# Patient Record
Sex: Female | Born: 1955 | Race: Black or African American | Hispanic: No | Smoking: Never smoker
Health system: Southern US, Community
[De-identification: ages and names within clinical notes are randomized; demographics above are authoritative.]

## PROBLEM LIST (undated history)

## (undated) DIAGNOSIS — E119 Type 2 diabetes mellitus without complications: Secondary | ICD-10-CM

## (undated) DIAGNOSIS — G4730 Sleep apnea, unspecified: Secondary | ICD-10-CM

## (undated) DIAGNOSIS — I10 Essential (primary) hypertension: Secondary | ICD-10-CM

## (undated) DIAGNOSIS — K5790 Diverticulosis of intestine, part unspecified, without perforation or abscess without bleeding: Secondary | ICD-10-CM

## (undated) HISTORY — DX: Essential (primary) hypertension: I10

## (undated) HISTORY — DX: Sleep apnea, unspecified: G47.30

## (undated) HISTORY — DX: Type 2 diabetes mellitus without complications: E11.9

---

## 2003-01-16 ENCOUNTER — Other Ambulatory Visit: Admission: RE | Admit: 2003-01-16 | Discharge: 2003-01-16 | Payer: Self-pay | Admitting: Obstetrics and Gynecology

## 2003-03-05 ENCOUNTER — Encounter: Admission: RE | Admit: 2003-03-05 | Discharge: 2003-03-05 | Payer: Self-pay | Admitting: Obstetrics and Gynecology

## 2004-03-11 ENCOUNTER — Other Ambulatory Visit: Admission: RE | Admit: 2004-03-11 | Discharge: 2004-03-11 | Payer: Self-pay | Admitting: Obstetrics and Gynecology

## 2004-03-27 ENCOUNTER — Encounter: Admission: RE | Admit: 2004-03-27 | Discharge: 2004-03-27 | Payer: Self-pay | Admitting: Obstetrics and Gynecology

## 2005-03-17 ENCOUNTER — Other Ambulatory Visit: Admission: RE | Admit: 2005-03-17 | Discharge: 2005-03-17 | Payer: Self-pay | Admitting: Obstetrics and Gynecology

## 2005-04-06 ENCOUNTER — Encounter: Admission: RE | Admit: 2005-04-06 | Discharge: 2005-04-06 | Payer: Self-pay | Admitting: Obstetrics and Gynecology

## 2005-06-01 ENCOUNTER — Ambulatory Visit (HOSPITAL_BASED_OUTPATIENT_CLINIC_OR_DEPARTMENT_OTHER): Admission: RE | Admit: 2005-06-01 | Discharge: 2005-06-01 | Payer: Self-pay | Admitting: Family Medicine

## 2005-06-07 ENCOUNTER — Ambulatory Visit: Payer: Self-pay | Admitting: Internal Medicine

## 2006-04-09 ENCOUNTER — Encounter: Admission: RE | Admit: 2006-04-09 | Discharge: 2006-04-09 | Payer: Self-pay | Admitting: Obstetrics and Gynecology

## 2007-07-20 ENCOUNTER — Encounter: Admission: RE | Admit: 2007-07-20 | Discharge: 2007-07-20 | Payer: Self-pay | Admitting: Obstetrics and Gynecology

## 2007-07-29 DIAGNOSIS — R87810 Cervical high risk human papillomavirus (HPV) DNA test positive: Secondary | ICD-10-CM | POA: Insufficient documentation

## 2007-07-29 DIAGNOSIS — R87619 Unspecified abnormal cytological findings in specimens from cervix uteri: Secondary | ICD-10-CM | POA: Insufficient documentation

## 2008-07-01 ENCOUNTER — Emergency Department (HOSPITAL_BASED_OUTPATIENT_CLINIC_OR_DEPARTMENT_OTHER): Admission: EM | Admit: 2008-07-01 | Discharge: 2008-07-01 | Payer: Self-pay | Admitting: Emergency Medicine

## 2008-08-20 ENCOUNTER — Encounter: Admission: RE | Admit: 2008-08-20 | Discharge: 2008-08-20 | Payer: Self-pay | Admitting: Obstetrics and Gynecology

## 2009-09-13 ENCOUNTER — Encounter: Admission: RE | Admit: 2009-09-13 | Discharge: 2009-09-13 | Payer: Self-pay | Admitting: Obstetrics and Gynecology

## 2010-02-12 ENCOUNTER — Encounter: Admission: RE | Admit: 2010-02-12 | Payer: Self-pay | Source: Home / Self Care | Admitting: Family Medicine

## 2010-02-12 ENCOUNTER — Encounter
Admission: RE | Admit: 2010-02-12 | Discharge: 2010-02-12 | Payer: Self-pay | Source: Home / Self Care | Attending: Family Medicine | Admitting: Family Medicine

## 2010-06-20 NOTE — Procedures (Signed)
Sydney Cooper, Sydney Cooper                ACCOUNT NO.:  [id number]   MEDICAL RECORD NO.:  [id number]          PATIENT TYPE:  OUT   LOCATION:  SLEEP CENTER                 FACILITY:  [facility]   PHYSICIAN:  Clinton D. Maple Hudson, M.D. DATE OF BIRTH:  Jun 11, 1955   DATE OF STUDY:  06/01/2005                              NOCTURNAL POLYSOMNOGRAM   REFERRING PHYSICIAN:  Dr. Alwyn Pea.   INDICATIONS FOR STUDY:  Hypersomnia with sleep apnea.   EPWORTH SLEEPINESS SCORE:  08/24.   BMI:  1.  Weight 238 pounds.   No medication listed.   SLEEP ARCHITECTURE:  Total sleep time 346 minutes with sleep efficiency 75%.  Stage I was 8%, stage II 65%, stages III and IV 6%, REM 21% of total sleep  time.  Sleep latency 8 minutes, REM latency 101 minutes, awake after sleep  onset 109 minutes, arousal index 20.  No bedtime medication taken.   RESPIRATORY DATA:  Split study protocol.  Apnea/hypopnea index (AHI, RDI)  20.1 obstructive events per hour before CPAP indicating moderate obstructive  sleep apnea/hypopnea syndrome.  This included 23 obstructive apneas and 29  hypopneas before CPAP.  Events were not positional.  REM AHI 26.1 per hour.  CPAP was titrated to 7 CWP, AHI 0.8 per hour.  A medium Respironics OptiLife  nasal pillows mask was used with heated humidifier.  She experienced panic  attacks and claustrophobia on first putting the mask on and needed TV on  distract her. Once  sleep, she did well with CPAP.   OXYGEN DATA:  Moderate snoring with oxygen desaturation to a nadir of 89%.  After CPAP control saturation held 97-98% on room air.   CARDIAC DATA:  Normal sinus rhythm with PVCs including ventricular bigeminy  and trigeminy.   MOVEMENT/PARASOMNIAS:  No unusual limb or body movement.  Bathroom x2.   IMPRESSION/RECOMMENDATIONS:  1.  Moderate obstructive sleep apnea/hypopnea syndrome, AHI 20.1 per hour      with non positional events, moderate snoring and oxygen desaturation to      a nadir of  89%.  2.  Successful CPAP titration to 7 CWP, AHI 0.8 per hour.  A medium      Respironics OptiLife nasal pillows mask was used with heated humidifier.      She had an initial panic and claustrophobia response to the sensation of      applying mask but adjusted and subsequently did well.  3.  Frequent PVCs including ventricular bigeminy and trigeminy.      Clinton D. Maple Hudson, M.D.  Diplomate, Biomedical engineer of Sleep Medicine  Electronically Signed     CDY/MEDQ  D:  06/07/2005 10:44:40  T:  06/08/2005 12:11:59  Job:  [id number]

## 2011-04-28 ENCOUNTER — Ambulatory Visit (INDEPENDENT_AMBULATORY_CARE_PROVIDER_SITE_OTHER): Payer: Federal, State, Local not specified - PPO | Admitting: Obstetrics and Gynecology

## 2011-04-28 DIAGNOSIS — Z124 Encounter for screening for malignant neoplasm of cervix: Secondary | ICD-10-CM

## 2011-04-28 DIAGNOSIS — Z01419 Encounter for gynecological examination (general) (routine) without abnormal findings: Secondary | ICD-10-CM

## 2011-12-09 ENCOUNTER — Other Ambulatory Visit: Payer: Self-pay | Admitting: Obstetrics and Gynecology

## 2011-12-09 DIAGNOSIS — Z1231 Encounter for screening mammogram for malignant neoplasm of breast: Secondary | ICD-10-CM

## 2011-12-17 ENCOUNTER — Ambulatory Visit
Admission: RE | Admit: 2011-12-17 | Discharge: 2011-12-17 | Disposition: A | Discharge status: Home / Self Care | Payer: Federal, State, Local not specified - PPO | Source: Ambulatory Visit | Attending: Obstetrics and Gynecology | Admitting: Obstetrics and Gynecology

## 2011-12-17 DIAGNOSIS — Z1231 Encounter for screening mammogram for malignant neoplasm of breast: Secondary | ICD-10-CM

## 2011-12-18 ENCOUNTER — Encounter: Payer: Self-pay | Admitting: Obstetrics and Gynecology

## 2013-01-24 ENCOUNTER — Other Ambulatory Visit: Payer: Self-pay

## 2013-01-24 DIAGNOSIS — Z1231 Encounter for screening mammogram for malignant neoplasm of breast: Secondary | ICD-10-CM

## 2013-01-30 ENCOUNTER — Ambulatory Visit: Payer: Federal, State, Local not specified - PPO

## 2013-02-07 ENCOUNTER — Ambulatory Visit
Admission: RE | Admit: 2013-02-07 | Discharge: 2013-02-07 | Disposition: A | Discharge status: Home / Self Care | Payer: Federal, State, Local not specified - PPO | Source: Ambulatory Visit

## 2013-02-07 DIAGNOSIS — Z1231 Encounter for screening mammogram for malignant neoplasm of breast: Secondary | ICD-10-CM

## 2014-01-23 ENCOUNTER — Encounter: Payer: Federal, State, Local not specified - PPO | Attending: Family Medicine | Admitting: Dietician

## 2014-01-23 ENCOUNTER — Encounter: Payer: Self-pay | Admitting: Dietician

## 2014-01-23 VITALS — Ht 68.0 in | Wt 254.0 lb

## 2014-01-23 DIAGNOSIS — Z6838 Body mass index (BMI) 38.0-38.9, adult: Secondary | ICD-10-CM | POA: Insufficient documentation

## 2014-01-23 DIAGNOSIS — E669 Obesity, unspecified: Secondary | ICD-10-CM | POA: Insufficient documentation

## 2014-01-23 DIAGNOSIS — Z713 Dietary counseling and surveillance: Secondary | ICD-10-CM | POA: Diagnosis not present

## 2014-01-23 NOTE — Progress Notes (Signed)
  Medical Nutrition Therapy:  Appt start time: 1400 end time:  1500.   Assessment:  Primary concerns today: Weight/sleep apnea, HTN.   Lives alone and does her own shopping.  Usually 2 meals and 1 snack daily.  Occasionally 3 meals daily.  Increased restaurant.  Travels a lot.  Patient recently retired from [employer].  Increased eating out and irregular meal pattern.  Good exercise habits.  Patient states that she is not a stress eater.  At home eats in front of the TV.   Preferred Learning Style:   Auditory  Visual  Hands on   Learning Readiness:   Ready   MEDICATIONS: Reviewed   DIETARY INTAKE:  Usual eating pattern includes 2 meals and 1 snacks per day.  Avoided foods include none.    24-hr recall:  B ( AM): Bagel with cream cheese and coffee or bacon and eggs once per week, or meal replacement shake, or smoothie with ice, frozen fruit  Snk ( AM): none  L ( PM): 2:00 out to eat ([place], [place], burger and fries or wings or other out to eat) Snk ( PM): none D ( PM): none Snk ( PM): Cheese and crackers Beverages:  About 24 oz Water, 6 oz wine 2-3 x per week, occasional iced tea (1/2 and 1/2 sweet)  Occasionally will eat 3 meals  Usual physical activity: Walks about 2 miles 5-6 days per week with neighbor.  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    Estimated energy needs: 1400-1600 calories  Progress Towards Goal(s):  In progress.   Nutritional Diagnosis:  [place]-3.3 Overweight/obesity As related to increased caloric intake.  As evidenced by BMI of 38.6.    Intervention:  Nutrition Counceling. Discussed more regular meal schedule, increased water intake, healthy snack choices, eating out, mindful eating. Teaching Method Utilized:  Visual Auditory Hands on  Handouts given during visit include:  Plate method  Snack list  Meal planning card  Weight loss tips  Tanita strip and fact sheet  Barriers to learning/adherence to lifestyle change:  none  Demonstrated degree of understanding via:  Teach Back   Monitoring/Evaluation:  Dietary intake, exercise, and body weight in 1 month(s).

## 2014-01-23 NOTE — Patient Instructions (Signed)
Continue great exercise habits! Add a protein to breakfast. Increase your water intake. Snack choices with a protein and carbohydrate choice Plan and cook more often rather than eating out   (Eat out 2x/week and cook 5x per week) Being mindful of food choices when eating out.

## 2014-02-26 ENCOUNTER — Ambulatory Visit: Payer: Federal, State, Local not specified - PPO | Admitting: Dietician

## 2014-07-31 ENCOUNTER — Emergency Department (HOSPITAL_BASED_OUTPATIENT_CLINIC_OR_DEPARTMENT_OTHER)
Admission: EM | Admit: 2014-07-31 | Discharge: 2014-07-31 | Disposition: A | Discharge status: Home / Self Care | Payer: Federal, State, Local not specified - PPO | Attending: Emergency Medicine | Admitting: Emergency Medicine

## 2014-07-31 ENCOUNTER — Emergency Department (HOSPITAL_BASED_OUTPATIENT_CLINIC_OR_DEPARTMENT_OTHER): Payer: Federal, State, Local not specified - PPO

## 2014-07-31 ENCOUNTER — Encounter (HOSPITAL_BASED_OUTPATIENT_CLINIC_OR_DEPARTMENT_OTHER): Payer: Self-pay

## 2014-07-31 DIAGNOSIS — Z79899 Other long term (current) drug therapy: Secondary | ICD-10-CM | POA: Diagnosis not present

## 2014-07-31 DIAGNOSIS — Z8669 Personal history of other diseases of the nervous system and sense organs: Secondary | ICD-10-CM | POA: Insufficient documentation

## 2014-07-31 DIAGNOSIS — K5730 Diverticulosis of large intestine without perforation or abscess without bleeding: Secondary | ICD-10-CM | POA: Diagnosis not present

## 2014-07-31 DIAGNOSIS — I10 Essential (primary) hypertension: Secondary | ICD-10-CM | POA: Insufficient documentation

## 2014-07-31 DIAGNOSIS — K5732 Diverticulitis of large intestine without perforation or abscess without bleeding: Secondary | ICD-10-CM

## 2014-07-31 DIAGNOSIS — Z3202 Encounter for pregnancy test, result negative: Secondary | ICD-10-CM | POA: Insufficient documentation

## 2014-07-31 DIAGNOSIS — R1032 Left lower quadrant pain: Secondary | ICD-10-CM | POA: Diagnosis present

## 2014-07-31 LAB — CBC WITH DIFFERENTIAL/PLATELET
BASOS ABS: 0.1 10*3/uL (ref 0.0–0.1)
Basophils Relative: 1 % (ref 0–1)
Eosinophils Absolute: 0.1 10*3/uL (ref 0.0–0.7)
Eosinophils Relative: 2 % (ref 0–5)
HCT: 41 % (ref 36.0–46.0)
HEMOGLOBIN: 13.4 g/dL (ref 12.0–15.0)
LYMPHS ABS: 1.6 10*3/uL (ref 0.7–4.0)
LYMPHS PCT: 24 % (ref 12–46)
MCH: 30.6 pg (ref 26.0–34.0)
MCHC: 32.7 g/dL (ref 30.0–36.0)
MCV: 93.6 fL (ref 78.0–100.0)
Monocytes Absolute: 0.4 10*3/uL (ref 0.1–1.0)
Monocytes Relative: 5 % (ref 3–12)
NEUTROS ABS: 4.7 10*3/uL (ref 1.7–7.7)
NEUTROS PCT: 68 % (ref 43–77)
PLATELETS: 297 10*3/uL (ref 150–400)
RBC: 4.38 MIL/uL (ref 3.87–5.11)
RDW: 13.1 % (ref 11.5–15.5)
WBC: 6.8 10*3/uL (ref 4.0–10.5)

## 2014-07-31 LAB — URINALYSIS, ROUTINE W REFLEX MICROSCOPIC
Bilirubin Urine: NEGATIVE
Glucose, UA: NEGATIVE mg/dL
Hgb urine dipstick: NEGATIVE
KETONES UR: NEGATIVE mg/dL
NITRITE: NEGATIVE
PH: 5.5 (ref 5.0–8.0)
Protein, ur: NEGATIVE mg/dL
SPECIFIC GRAVITY, URINE: 1.023 (ref 1.005–1.030)
Urobilinogen, UA: 1 mg/dL (ref 0.0–1.0)

## 2014-07-31 LAB — COMPREHENSIVE METABOLIC PANEL
ALT: 20 U/L (ref 14–54)
ANION GAP: 11 (ref 5–15)
AST: 22 U/L (ref 15–41)
Albumin: 3.9 g/dL (ref 3.5–5.0)
Alkaline Phosphatase: 58 U/L (ref 38–126)
BUN: 17 mg/dL (ref 6–20)
CALCIUM: 8.9 mg/dL (ref 8.9–10.3)
CHLORIDE: 104 mmol/L (ref 101–111)
CO2: 24 mmol/L (ref 22–32)
CREATININE: 0.92 mg/dL (ref 0.44–1.00)
GFR calc Af Amer: >60 mL/min (ref >60)
GFR calc non Af Amer: >60 mL/min (ref >60)
GLUCOSE: 108 mg/dL — AB (ref 65–99)
Potassium: 3.8 mmol/L (ref 3.5–5.1)
Sodium: 139 mmol/L (ref 135–145)
TOTAL PROTEIN: 7.8 g/dL (ref 6.5–8.1)
Total Bilirubin: 0.4 mg/dL (ref 0.3–1.2)

## 2014-07-31 LAB — PREGNANCY, URINE: Preg Test, Ur: NEGATIVE

## 2014-07-31 LAB — URINE MICROSCOPIC-ADD ON

## 2014-07-31 MED ORDER — METRONIDAZOLE 500 MG PO TABS: 500.0000 mg | ORAL_TABLET | Freq: Two times a day (BID) | ORAL | Status: DC

## 2014-07-31 MED ORDER — ONDANSETRON HCL 4 MG/2ML IJ SOLN
4.0000 mg | Freq: Once | INTRAMUSCULAR | Status: AC
  Administered 2014-07-31: 4 mg via INTRAVENOUS
  Filled 2014-07-31: qty 2

## 2014-07-31 MED ORDER — OXYCODONE-ACETAMINOPHEN 5-325 MG PO TABS
1.0000 | ORAL_TABLET | Freq: Three times a day (TID) | ORAL | Status: DC | PRN
Start: 2014-07-31 — End: 2014-09-07

## 2014-07-31 MED ORDER — CIPROFLOXACIN HCL 500 MG PO TABS: 500.0000 mg | ORAL_TABLET | Freq: Two times a day (BID) | ORAL | Status: DC

## 2014-07-31 MED ORDER — MORPHINE SULFATE 4 MG/ML IJ SOLN
4.0000 mg | Freq: Once | INTRAMUSCULAR | Status: AC
  Administered 2014-07-31: 4 mg via INTRAVENOUS
  Filled 2014-07-31: qty 1

## 2014-07-31 NOTE — Discharge Instructions (Signed)
As discussed if you develop fever of worsening symptoms then you need to be reseen Diverticulitis Diverticulitis is when small pockets that have formed in your colon (large intestine) become infected or swollen. HOME CARE  Follow your doctor's instructions.  Follow a special diet if told by your doctor.  When you feel better, your doctor may tell you to change your diet. You may be told to eat a lot of fiber. Fruits and vegetables are good sources of fiber. Fiber makes it easier to poop (have bowel movements).  Take supplements or probiotics as told by your doctor.  Only take medicines as told by your doctor.  Keep all follow-up visits with your doctor. GET HELP IF:  Your pain does not get better.  You have a hard time eating food.  You are not pooping like normal. GET HELP RIGHT AWAY IF:  Your pain gets worse.  Your problems do not get better.  Your problems suddenly get worse.  You have a fever.  You keep throwing up (vomiting).  You have bloody or black, tarry poop (stool). MAKE SURE YOU:   Understand these instructions.  Will watch your condition.  Will get help right away if you are not doing well or get worse. Document Released: 07/08/2007 Document Revised: 01/24/2013 Document Reviewed: 12/14/2012 [facility] Patient Information 2015 [place] [place]. This information is not intended to replace advice given to you by your health care provider. Make sure you discuss any questions you have with your health care provider.

## 2014-07-31 NOTE — ED Provider Notes (Signed)
CSN: [id number]     Arrival date & time 07/31/14  1349 History   First MD Initiated Contact with Patient 07/31/14 1404     Chief Complaint  Patient presents with  . Abdominal Pain     (Consider location/radiation/quality/duration/timing/severity/associated sxs/prior Treatment) Patient is a 59 y.o. female presenting with abdominal pain. The history is provided by the patient. No language interpreter was used.  Abdominal Pain Pain location:  LLQ Pain quality: aching   Pain radiates to:  Back Pain severity:  Moderate Onset quality:  Gradual Duration:  3 days Progression:  Worsening Relieved by:  Nothing Worsened by:  Position changes Ineffective treatments:  NSAIDs Associated symptoms: nausea   Associated symptoms: no dysuria, no fever, no vaginal discharge and no vomiting     Past Medical History  Diagnosis Date  . Hypertension   . Sleep apnea    No past surgical history on file. No family history on file. History  Substance Use Topics  . Smoking status: Never Smoker   . Smokeless tobacco: Not on file  . Alcohol Use: No   OB History    No data available     Review of Systems  Constitutional: Negative for fever.  Gastrointestinal: Positive for nausea and abdominal pain. Negative for vomiting.  Genitourinary: Negative for dysuria and vaginal discharge.  All other systems reviewed and are negative.     Allergies  Review of patient's allergies indicates no known allergies.  Home Medications   Prior to Admission medications   Medication Sig Start Date End Date Taking? Authorizing Provider  hydrochlorothiazide (HYDRODIURIL) 25 MG tablet Take 25 mg by mouth daily.    Historical Provider, MD  olmesartan (BENICAR) 40 MG tablet Take 40 mg by mouth daily.    Historical Provider, MD   BP 120/52 mmHg  Pulse 69  Temp(Src) 98.7 F (37.1 C) (Oral)  Resp 16  Ht  (1.727 m)  Wt 247 lb (112.038 kg)  BMI 37.56 kg/m2  SpO2 98% Physical Exam  Constitutional: She is  oriented to person, place, and time. She appears well-developed and well-nourished.  Cardiovascular: Normal rate and regular rhythm.   Pulmonary/Chest: Effort normal and breath sounds normal.  Abdominal: Soft. Bowel sounds are normal. There is tenderness in the left lower quadrant.  Musculoskeletal: Normal range of motion.  Neurological: She is alert and oriented to person, place, and time. She exhibits normal muscle tone. Coordination normal.  Skin: Skin is warm and dry.  Psychiatric: She has a normal mood and affect.  Nursing note and vitals reviewed.   ED Course  Procedures (including critical care time) Labs Review Labs Reviewed  URINALYSIS, ROUTINE W REFLEX MICROSCOPIC (NOT AT [facility]) - Abnormal; Notable for the following:    Leukocytes, UA SMALL (*)    All other components within normal limits  COMPREHENSIVE METABOLIC PANEL - Abnormal; Notable for the following:    Glucose, Bld 108 (*)    All other components within normal limits  URINE MICROSCOPIC-ADD ON - Abnormal; Notable for the following:    Bacteria, UA FEW (*)    All other components within normal limits  PREGNANCY, URINE  CBC WITH DIFFERENTIAL/PLATELET    Imaging Review Ct Abdomen Pelvis Wo Contrast  07/31/2014   CLINICAL DATA:  Left lower quadrant pain for 3 days, low back pain for 4 these,  EXAM: CT ABDOMEN AND PELVIS WITHOUT CONTRAST  TECHNIQUE: Multidetector CT imaging of the abdomen and pelvis was performed following the standard protocol without IV contrast.  COMPARISON:  None.  FINDINGS: Lung bases are unremarkable. A emphysematous bulla is noted in left base retrocardiac. Sagittal images of the spine shows small Schmorl's node deformity upper endplate of L3 vertebral body.  Unenhanced liver shows no biliary ductal dilatation. No calcified gallstones are noted within gallbladder. Unenhanced pancreas, spleen and adrenal glands are unremarkable.  Abdominal aorta is unremarkable.  Kidneys are symmetrical in size. No  nephrolithiasis. No hydronephrosis or hydroureter. No calcified ureteral calculi are noted. The urinary bladder is under distended unremarkable.  No small bowel obstruction. No ascites or free air. No adenopathy. There is no pericecal inflammation. Normal retrocecal appendix noted in axial image 56. The terminal ileum is unremarkable. Scattered colonic diverticula are noted descending colon. Multiple sigmoid colon diverticula. There is no evidence of acute diverticulitis.  In axial image 61 there is subtle mild stranding of pericolonic fat in left lower quadrant proximal sigmoid colon. Findings are suspicious for mild focal diverticulitis. There is no evidence of diverticular abscess. No evidence of perforation. Multiple colonic diverticula are noted in proximal sigmoid colon. No other foci of diverticulitis are not  The unenhanced uterus and adnexa are unremarkable.  IMPRESSION: 1. Normal appendix.  No pericecal inflammation. 2. No nephrolithiasis.  No hydronephrosis or hydroureter. 3. No calcified ureteral calculi. 4. Scattered diverticula are noted descending colon. There is subtle mild stranding of pericolonic fat in proximal sigmoid colon in left lower quadrant axial image 61. Mild diverticulitis cannot be excluded. There is no diverticular abscess. No perforation. Additional multiple colonic diverticula are noted in proximal sigmoid colon without other foci of acute diverticulitis. 5. Unremarkable unenhanced uterus and adnexa.   Electronically Signed   By: Natasha MeadLiviu  Pop M.D.   On: 07/31/2014 14:47     EKG Interpretation None      MDM   Final diagnoses:  Diverticulitis of sigmoid colon    Will treat for possible diverticulitis with cipro and flagyl and oxycodone. Discussed return precautions.no definite abscess noted.    Teressa LowerVrinda Keerat Denicola, NP 07/31/14 1603  Jerelyn ScottMartha Linker, MD 08/03/14 1525

## 2014-07-31 NOTE — ED Notes (Signed)
LLQ pain x 3 days-lower back pain x 4 days

## 2014-08-17 ENCOUNTER — Encounter: Payer: Self-pay | Admitting: Podiatry

## 2014-08-17 ENCOUNTER — Ambulatory Visit (INDEPENDENT_AMBULATORY_CARE_PROVIDER_SITE_OTHER): Payer: Federal, State, Local not specified - PPO | Admitting: Podiatry

## 2014-08-17 ENCOUNTER — Ambulatory Visit (INDEPENDENT_AMBULATORY_CARE_PROVIDER_SITE_OTHER): Payer: Federal, State, Local not specified - PPO

## 2014-08-17 VITALS — BP 120/80 | HR 68 | Resp 15

## 2014-08-17 DIAGNOSIS — M779 Enthesopathy, unspecified: Secondary | ICD-10-CM

## 2014-08-17 DIAGNOSIS — M79672 Pain in left foot: Secondary | ICD-10-CM

## 2014-08-17 MED ORDER — TRIAMCINOLONE ACETONIDE 10 MG/ML IJ SUSP
10.0000 mg | Freq: Once | INTRAMUSCULAR | Status: AC
  Administered 2014-08-17: 10 mg

## 2014-08-17 MED ORDER — NAPROXEN 500 MG PO TABS: 500.0000 mg | ORAL_TABLET | Freq: Two times a day (BID) | ORAL | Status: DC

## 2014-08-17 NOTE — Progress Notes (Signed)
Subjective:     Patient ID: Sydney ButteDeborah Cooper, female   DOB: [birth year], 59 y.o.   MRN: [id number]  HPI patient states she's been having a lot of pain in her right ankle and it started a couple months ago and she does not remember specific injury. States it's sore with walking or standing   Review of Systems  All other systems reviewed and are negative.      Objective:   Physical Exam  Constitutional: She is oriented to person, place, and time.  Cardiovascular: Intact distal pulses.   Musculoskeletal: Normal range of motion.  Neurological: She is oriented to person, place, and time.  Skin: Skin is warm.  Nursing note and vitals reviewed.  neurovascular status intact muscle strength adequate range of motion within normal limits with severe discomfort in the right ankle around the posterior tibial tendon as it comes under the medial malleolus and inserts into the navicular. I checked muscle strength and found to be adequate and patient was noted to have good digital perfusion and is well oriented 3     Assessment:     Posterior tibial tendinitis right with inflammation and moderate flatfoot deformity as complicating factor with chance for tear but unlikely at the current time    Plan:     H&P and x-rays reviewed and today I went ahead and I did a careful sheath injection first explaining chances for rupture 3 mg Kenalog 5 mg Xylocaine and then went ahead and applied fascial brace with instructions and discussed long-term orthotics. Reappoint to recheck in 3 weeks or earlier if needed and begin diclofenac 75 mg twice a day [employer]

## 2014-08-17 NOTE — Progress Notes (Signed)
   Subjective:    Patient ID: Sydney ButteDeborah Cooper, female    DOB: 22-Oct-1955, 59 y.o.   MRN: [id number]  HPI P t presents with pain on medial side of right ankle at the ankle jointed with noted swelling. She states this has lasted for 2 molnths, she walks frequently and wraps foot and wears supportive shoes but pain and swelling have worsened   Review of Systems  All other systems reviewed and are negative.      Objective:   Physical Exam        Assessment & Plan:

## 2014-09-07 ENCOUNTER — Encounter: Payer: Self-pay | Admitting: Podiatry

## 2014-09-07 ENCOUNTER — Ambulatory Visit (INDEPENDENT_AMBULATORY_CARE_PROVIDER_SITE_OTHER): Payer: Federal, State, Local not specified - PPO | Admitting: Podiatry

## 2014-09-07 VITALS — BP 104/53 | HR 63 | Resp 14

## 2014-09-07 DIAGNOSIS — M779 Enthesopathy, unspecified: Secondary | ICD-10-CM

## 2014-09-08 NOTE — Progress Notes (Signed)
Subjective:     Patient ID: Sydney Cooper, female   DOB: 11-26-1955, 59 y.o.   MRN: [id number]  HPI patient states my right heel is doing some better but still painful and I know I have the depression of the arch which is a part of the problem   Review of Systems     Objective:   Physical Exam Neurovascular status intact muscle strength adequate range of motion within normal limits with patient noted to have quite a bit of discomfort plantar aspect right heel at the insertional point tendon the calcaneus with fluid buildup around the medial band and depression of the arch noted    Assessment:     Continue plantar fasciitis with moderate improvement right and long-term structural abnormalities    Plan:     Reviewed condition and at this time went ahead and scanned for custom orthotic devices. I then went ahead reviewed continued physical therapy and shoe gear modifications

## 2014-10-19 ENCOUNTER — Encounter: Payer: Federal, State, Local not specified - PPO | Attending: Family Medicine | Admitting: *Deleted

## 2014-10-19 ENCOUNTER — Encounter: Payer: Self-pay | Admitting: *Deleted

## 2014-10-19 DIAGNOSIS — Z713 Dietary counseling and surveillance: Secondary | ICD-10-CM | POA: Insufficient documentation

## 2014-10-19 DIAGNOSIS — Z6837 Body mass index (BMI) 37.0-37.9, adult: Secondary | ICD-10-CM | POA: Diagnosis not present

## 2014-10-19 DIAGNOSIS — E669 Obesity, unspecified: Secondary | ICD-10-CM | POA: Diagnosis not present

## 2014-10-19 DIAGNOSIS — R7303 Prediabetes: Secondary | ICD-10-CM

## 2014-10-19 DIAGNOSIS — R7309 Other abnormal glucose: Secondary | ICD-10-CM | POA: Diagnosis not present

## 2014-10-19 NOTE — Progress Notes (Signed)
Diabetes Self-Management Education  Visit Type:  Follow-up.  Patient was seen in this clinic 01/2014.  Is here for more education on her prediabetes  Appt. Start Time: 0900 Appt. End Time: 1000  10/19/2014  Ms. Sydney Cooper, identified by name and date of birth, is a 59 y.o. female with a diagnosis of pre-Diabetes:  .   ASSESSMENT  There were no vitals taken for this visit. There is no weight on file to calculate BMI.       Diabetes Self-Management Education - 10/19/14 0907    Health Coping   How would you rate your overall health? Good   Psychosocial Assessment   Patient Belief/Attitude about Diabetes Afraid   Self-care barriers None   Self-management support Doctor's office;Family   Patient Concerns Nutrition/Meal planning;Glycemic Control;Weight Control   Preferred Learning Style Auditory;Visual;Hands on   Learning Readiness Ready   Complications   Last HgB A1C per patient/outside source 6.1 %   How often do you check your blood sugar? 0 times/day (not testing)   Have you had a dilated eye exam in the past 12 months? No   Have you had a dental exam in the past 12 months? Yes   Are you checking your feet? No   Dietary Intake   Breakfast hot tea and 1 slice oatmeal toast with butter and some jelly   Lunch late lunch yesterday: clam chowder with tossed salad with ranch.  Psychologist, educational, fruit   Beverage(s) white wine, tea, water   Exercise   Exercise Type Light (walking / raking leaves)   How many days per week to you exercise? 6   How many minutes per day do you exercise? 45   Total minutes per week of exercise 270   Patient Education   Previous Diabetes Education No   Disease state  Definition of diabetes, type 1 and 2, and the diagnosis of diabetes;Factors that contribute to the development of diabetes;Explored patient's options for treatment of their diabetes   Nutrition management  Role of diet in the treatment of diabetes and the relationship  between the three main macronutrients and blood glucose level;Food label reading, portion sizes and measuring food.;Carbohydrate counting;Effects of alcohol on blood glucose and safety factors with consumption of alcohol.;Information on hints to eating out and maintain blood glucose control.   Physical activity and exercise  Role of exercise on diabetes management, blood pressure control and cardiac health.   Monitoring Purpose and frequency of SMBG.;Daily foot exams;Yearly dilated eye exam   Acute complications Taught treatment of hypoglycemia - the 15 rule.   Chronic complications Relationship between chronic complications and blood glucose control   Psychosocial adjustment Role of stress on diabetes   Individualized Goals (developed by patient)   Nutrition Follow meal plan discussed;General guidelines for healthy choices and portions discussed   Physical Activity Exercise 5-7 days per week   Outcomes   Program Status Completed   Subsequent Visit   Since your last visit have you continued or begun to take your medications as prescribed? Not on Medications   Since your last visit have you had your blood pressure checked? Yes   Since your last visit have you experienced any weight changes? No change   Since your last visit, are you checking your blood glucose at least once a day? No      Learning Objective:  Patient will have a greater understanding of diabetes self-management. Patient education plan is to attend individual and/or group sessions  per assessed needs and concerns.     Expected Outcomes:  Demonstrated interest in learning. Expect positive outcomes  Education material provided: Living Well with Diabetes, Meal plan card and Snack sheet  If problems or questions, patient to contact team via:  Phone  Future DSME appointment: - 6 months

## 2015-01-28 ENCOUNTER — Encounter: Payer: Self-pay | Admitting: Podiatry

## 2015-01-28 ENCOUNTER — Ambulatory Visit (INDEPENDENT_AMBULATORY_CARE_PROVIDER_SITE_OTHER): Payer: Federal, State, Local not specified - PPO | Admitting: Podiatry

## 2015-01-28 DIAGNOSIS — M779 Enthesopathy, unspecified: Secondary | ICD-10-CM

## 2015-01-28 DIAGNOSIS — M79672 Pain in left foot: Secondary | ICD-10-CM

## 2015-01-28 MED ORDER — TRIAMCINOLONE ACETONIDE 10 MG/ML IJ SUSP
10.0000 mg | Freq: Once | INTRAMUSCULAR | Status: AC
  Administered 2015-01-28: 10 mg

## 2015-01-28 NOTE — Progress Notes (Signed)
Subjective:     Patient ID: Sydney ButteDeborah Cooper, female   DOB: 21-Nov-1955, 59 y.o.   MRN: [id number]  HPI patient points to the right foot stating that the tendon on the inside is really bothering her and she admits that she's been more active the last month and has not been able to wear her orthotics at all times and there is quite a few shoes that her orthotics do not fit   Review of Systems     Objective:   Physical Exam Neurovascular status intact muscle strength adequate with inflammation in the posterior tibial tendon right at its insertion into the navicular with moderate depression of the arch noted    Assessment:     Inflammatory tendinitis of the navicular posterior tibial junction right with no indication of muscle dysfunction and depression of the arch    Plan:     Scanned for secondary orthotics in order to provide for better support of the foot structure and went ahead today and did careful sheath injection right 3 mg Kenalog 5 mg Xylocaine and advised on reduced activity

## 2015-02-25 ENCOUNTER — Encounter: Payer: Self-pay | Admitting: Podiatry

## 2015-04-18 ENCOUNTER — Ambulatory Visit: Payer: Federal, State, Local not specified - PPO | Admitting: *Deleted

## 2015-04-28 ENCOUNTER — Emergency Department (HOSPITAL_BASED_OUTPATIENT_CLINIC_OR_DEPARTMENT_OTHER)
Admission: EM | Admit: 2015-04-28 | Discharge: 2015-04-28 | Disposition: A | Discharge status: Home / Self Care | Payer: Federal, State, Local not specified - PPO | Attending: Emergency Medicine | Admitting: Emergency Medicine

## 2015-04-28 ENCOUNTER — Encounter (HOSPITAL_BASED_OUTPATIENT_CLINIC_OR_DEPARTMENT_OTHER): Payer: Self-pay | Admitting: Emergency Medicine

## 2015-04-28 ENCOUNTER — Emergency Department (HOSPITAL_BASED_OUTPATIENT_CLINIC_OR_DEPARTMENT_OTHER): Payer: Federal, State, Local not specified - PPO

## 2015-04-28 DIAGNOSIS — Z8719 Personal history of other diseases of the digestive system: Secondary | ICD-10-CM | POA: Insufficient documentation

## 2015-04-28 DIAGNOSIS — R1032 Left lower quadrant pain: Secondary | ICD-10-CM | POA: Diagnosis present

## 2015-04-28 DIAGNOSIS — I10 Essential (primary) hypertension: Secondary | ICD-10-CM | POA: Insufficient documentation

## 2015-04-28 DIAGNOSIS — E119 Type 2 diabetes mellitus without complications: Secondary | ICD-10-CM | POA: Insufficient documentation

## 2015-04-28 DIAGNOSIS — Z79899 Other long term (current) drug therapy: Secondary | ICD-10-CM | POA: Diagnosis not present

## 2015-04-28 DIAGNOSIS — Z8669 Personal history of other diseases of the nervous system and sense organs: Secondary | ICD-10-CM | POA: Insufficient documentation

## 2015-04-28 HISTORY — DX: Diverticulosis of intestine, part unspecified, without perforation or abscess without bleeding: K57.90

## 2015-04-28 LAB — COMPREHENSIVE METABOLIC PANEL
ALK PHOS: 65 U/L (ref 38–126)
ALT: 33 U/L (ref 14–54)
ANION GAP: 7 (ref 5–15)
AST: 39 U/L (ref 15–41)
Albumin: 3.8 g/dL (ref 3.5–5.0)
BILIRUBIN TOTAL: 0.4 mg/dL (ref 0.3–1.2)
BUN: 16 mg/dL (ref 6–20)
CALCIUM: 9 mg/dL (ref 8.9–10.3)
CO2: 25 mmol/L (ref 22–32)
CREATININE: 0.74 mg/dL (ref 0.44–1.00)
Chloride: 108 mmol/L (ref 101–111)
GFR calc non Af Amer: >60 mL/min (ref >60)
GLUCOSE: 102 mg/dL — AB (ref 65–99)
Potassium: 3.2 mmol/L — ABNORMAL LOW (ref 3.5–5.1)
SODIUM: 140 mmol/L (ref 135–145)
TOTAL PROTEIN: 7.6 g/dL (ref 6.5–8.1)

## 2015-04-28 LAB — CBC
HCT: 39.7 % (ref 36.0–46.0)
Hemoglobin: 13.2 g/dL (ref 12.0–15.0)
MCH: 31.1 pg (ref 26.0–34.0)
MCHC: 33.2 g/dL (ref 30.0–36.0)
MCV: 93.4 fL (ref 78.0–100.0)
PLATELETS: 288 10*3/uL (ref 150–400)
RBC: 4.25 MIL/uL (ref 3.87–5.11)
RDW: 13.5 % (ref 11.5–15.5)
WBC: 6.2 10*3/uL (ref 4.0–10.5)

## 2015-04-28 LAB — URINALYSIS, ROUTINE W REFLEX MICROSCOPIC
BILIRUBIN URINE: NEGATIVE
Glucose, UA: NEGATIVE mg/dL
Hgb urine dipstick: NEGATIVE
Ketones, ur: NEGATIVE mg/dL
NITRITE: NEGATIVE
PH: 6.5 (ref 5.0–8.0)
Protein, ur: NEGATIVE mg/dL
SPECIFIC GRAVITY, URINE: 1.021 (ref 1.005–1.030)

## 2015-04-28 LAB — WET PREP, GENITAL
Clue Cells Wet Prep HPF POC: NONE SEEN
Sperm: NONE SEEN
Trich, Wet Prep: NONE SEEN
Yeast Wet Prep HPF POC: NONE SEEN

## 2015-04-28 LAB — URINE MICROSCOPIC-ADD ON: RBC / HPF: NONE SEEN RBC/hpf (ref 0–5)

## 2015-04-28 LAB — LIPASE, BLOOD: Lipase: 26 U/L (ref 11–51)

## 2015-04-28 MED ORDER — TRAMADOL HCL 50 MG PO TABS: 50.0000 mg | ORAL_TABLET | Freq: Four times a day (QID) | ORAL | Status: DC | PRN

## 2015-04-28 MED ORDER — KETOROLAC TROMETHAMINE 15 MG/ML IJ SOLN
15.0000 mg | Freq: Once | INTRAMUSCULAR | Status: AC
  Administered 2015-04-28: 15 mg via INTRAVENOUS
  Filled 2015-04-28: qty 1

## 2015-04-28 MED ORDER — IOPAMIDOL (ISOVUE-300) INJECTION 61%
100.0000 mL | Freq: Once | INTRAVENOUS | Status: AC | PRN
  Administered 2015-04-28: 100 mL via INTRAVENOUS

## 2015-04-28 NOTE — Discharge Instructions (Signed)

## 2015-04-28 NOTE — ED Notes (Signed)
PIV removed from R AC; catheter intact; site unremarkable with no redness and/or swelling.

## 2015-04-28 NOTE — ED Notes (Addendum)
Last ate 1500. Last BM ~ 1 hr ago (here). Pt alert, NAD, calm, interactive, resps e/u, speaking in clear complete sentences, no dyspnea noted.

## 2015-04-28 NOTE — ED Notes (Signed)
Asked pt to provide urine specimen. 

## 2015-04-28 NOTE — ED Notes (Signed)
Pt recent history of diverticulitis, awoke this am with llq pain, awaiting to see md in am but pain is 8/10

## 2015-04-28 NOTE — ED Provider Notes (Signed)
CSN: [id number]     Arrival date & time 04/28/15  1614 History   First MD Initiated Contact with Patient 04/28/15 1736     Chief Complaint  Patient presents with  . Abdominal Pain   HPI   60 year old female presents today with left lower quadrant pain. Patient reports that symptoms started this morning upon awakening. She reports that is sharp and does not radiate, and had not required any over-the-counter medications. Patient reports this pain feels similar to previous episode for which she was seen in the emergency room with CT scan showing questionable diverticulitis. She was given antibiotics at that time which resolved her symptoms. Patient denies any fever, chills, nausea, vomiting, upper abdominal pain, lower pelvic pain, changes in the color clarity or characteristics of her urine, flank pain, vaginal discharge, vaginal bleeding, or any other acute concerns today. Patient reports she's been able to tolerate food and drink without difficulty with no exacerbation of her symptoms. Patient reports that ambulation makes the pain worse.   Past Medical History  Diagnosis Date  . Hypertension   . Sleep apnea   . Diabetes mellitus without complication (HCC)     prediabetic  . Diverticular disease    History reviewed. No pertinent past surgical history. Family History  Problem Relation Age of Onset  . Hypertension Mother   . COPD Mother   . Asthma Mother   . Diabetes Maternal Uncle    Social History  Substance Use Topics  . Smoking status: Never Smoker   . Smokeless tobacco: Never Used  . Alcohol Use: No   OB History    No data available     Review of Systems  All other systems reviewed and are negative.   Allergies  Review of patient's allergies indicates no known allergies.  Home Medications   Prior to Admission medications   Medication Sig Start Date End Date Taking? Authorizing Provider  hydrochlorothiazide (HYDRODIURIL) 25 MG tablet Take 25 mg by mouth daily.     Historical Provider, MD  Multiple Vitamin (MULTIVITAMIN) tablet Take 1 tablet by mouth.    Historical Provider, MD  olmesartan (BENICAR) 40 MG tablet Take 40 mg by mouth daily.    Historical Provider, MD  traMADol (ULTRAM) 50 MG tablet Take 1 tablet (50 mg total) by mouth every 6 (six) hours as needed. 04/28/15   Almena Hokenson, PA-C   BP 122/71 mmHg  Pulse 56  Temp(Src) 98.9 F (37.2 C) (Oral)  Resp 18  Ht 5\' 8"  (1.727 m)  Wt 113.399 kg  BMI 38.02 kg/m2  SpO2 98%   Physical Exam  Constitutional: She is oriented to person, place, and time. She appears well-developed and well-nourished.  HENT:  Head: Normocephalic and atraumatic.  Eyes: Conjunctivae are normal. Pupils are equal, round, and reactive to light. Right eye exhibits no discharge. Left eye exhibits no discharge. No scleral icterus.  Neck: Normal range of motion. No JVD present. No tracheal deviation present.  Pulmonary/Chest: Effort normal. No stridor.  Abdominal: Soft. She exhibits no distension and no mass. There is tenderness. There is no rebound and no guarding.    Left lower quadrant pain to deep palpation, no other abdominal tenderness noted. No rebound or guarding, masses noted nonsurgical abdomen  Genitourinary: Uterus normal. Cervix exhibits no motion tenderness, no discharge and no friability. Right adnexum displays no mass and no tenderness. Left adnexum displays no mass and no tenderness.  Musculoskeletal: Normal range of motion. She exhibits no edema or tenderness.  Neurological:  She is alert and oriented to person, place, and time. Coordination normal.  Skin: Skin is warm and dry.  Psychiatric: She has a normal mood and affect. Her behavior is normal. Judgment and thought content normal.  Nursing note and vitals reviewed.   ED Course  Procedures (including critical care time) Labs Review Labs Reviewed  WET PREP, GENITAL - Abnormal; Notable for the following:    WBC, Wet Prep HPF POC MANY (*)    All other  components within normal limits  URINALYSIS, ROUTINE W REFLEX MICROSCOPIC (NOT AT [facility]) - Abnormal; Notable for the following:    Leukocytes, UA MODERATE (*)    All other components within normal limits  URINE MICROSCOPIC-ADD ON - Abnormal; Notable for the following:    Squamous Epithelial / LPF 0-5 (*)    Bacteria, UA MANY (*)    All other components within normal limits  COMPREHENSIVE METABOLIC PANEL - Abnormal; Notable for the following:    Potassium 3.2 (*)    Glucose, Bld 102 (*)    All other components within normal limits  URINE CULTURE  LIPASE, BLOOD  CBC  GC/CHLAMYDIA PROBE AMP ([place]) NOT AT [facility]    Imaging Review Ct Abdomen Pelvis W Contrast  04/28/2015  CLINICAL DATA:  Left lower quadrant pain for 1 day EXAM: CT ABDOMEN AND PELVIS WITH CONTRAST TECHNIQUE: Multidetector CT imaging of the abdomen and pelvis was performed using the standard protocol following bolus administration of intravenous contrast. CONTRAST:  ISOVUE-300 IOPAMIDOL (ISOVUE-300) INJECTION 61% COMPARISON:  07/31/2014 FINDINGS: Lung bases are free of acute infiltrate or sizable effusion. Minimal atelectatic changes are seen. The liver, gallbladder, spleen, adrenal glands and pancreas are normal in their CT appearance. Kidneys are well visualized bilaterally and demonstrate a normal enhancement pattern. Normal excretion is seen. No renal calculi are noted. The appendix is well visualized and within normal limits. Fecal material is noted throughout the colon. Mild diverticular change is seen without evidence of diverticulitis. The bladder is well distended. The uterus and ovaries are within normal limits. Degenerative changes of lumbar spine are noted. IMPRESSION: Diverticulosis without diverticulitis. The minimal bibasilar atelectatic changes. No other focal abnormality is seen. Electronically Signed   By: Alcide Clever M.D.   On: 04/28/2015 19:19   I have personally reviewed and evaluated these images and  lab results as part of my medical decision-making.   EKG Interpretation None      MDM   Final diagnoses:  Left lower quadrant pain    Labs:Lipase, CMP, CBC, urinalysis  Imaging: CT abdomen and pelvis  Consults:  Therapeutics:  Discharge Meds:   Assessment/Plan:60 year old female presents today with left lower abdomen and pelvic pain. Patient appears to be in no acute distress, nontoxic, afebrile with reassuring laboratory analysis. CT scan showed no signs of diverticulitis as patient's suspected, no other findings noted on the scan. Pelvic exam showed no cervical motion tenderness, significant discharge, bleeding, no adnexal tenderness to palpation. I suspect patient is having muscular pain as this is worsened with ambulation improved with rest. Patient will be instructed to use ibuprofen or Tylenol as needed for discomfort, follow up with her primary care for reevaluation. She is given strict return precautions, she verbalized understanding and agreement today's plan had no further questions or concerns at time of discharge        Eyvonne Mechanic, PA-C 04/29/15 1611  Doug Sou, MD 04/30/15 1312

## 2015-04-30 LAB — URINE CULTURE

## 2015-04-30 LAB — GC/CHLAMYDIA PROBE AMP (~~LOC~~) NOT AT ARMC
Chlamydia: NEGATIVE
Neisseria Gonorrhea: NEGATIVE

## 2015-08-26 DIAGNOSIS — R7301 Impaired fasting glucose: Secondary | ICD-10-CM | POA: Diagnosis not present

## 2015-08-26 DIAGNOSIS — G4730 Sleep apnea, unspecified: Secondary | ICD-10-CM | POA: Diagnosis not present

## 2015-08-26 DIAGNOSIS — G4733 Obstructive sleep apnea (adult) (pediatric): Secondary | ICD-10-CM | POA: Diagnosis not present

## 2015-08-26 DIAGNOSIS — I10 Essential (primary) hypertension: Secondary | ICD-10-CM | POA: Diagnosis not present

## 2015-09-25 ENCOUNTER — Ambulatory Visit (INDEPENDENT_AMBULATORY_CARE_PROVIDER_SITE_OTHER): Payer: Federal, State, Local not specified - PPO | Admitting: Podiatry

## 2015-09-25 ENCOUNTER — Encounter: Payer: Self-pay | Admitting: Podiatry

## 2015-09-25 ENCOUNTER — Ambulatory Visit (INDEPENDENT_AMBULATORY_CARE_PROVIDER_SITE_OTHER): Payer: Federal, State, Local not specified - PPO

## 2015-09-25 VITALS — BP 119/76 | HR 65

## 2015-09-25 DIAGNOSIS — M779 Enthesopathy, unspecified: Secondary | ICD-10-CM

## 2015-09-25 MED ORDER — TRIAMCINOLONE ACETONIDE 10 MG/ML IJ SUSP
10.0000 mg | Freq: Once | INTRAMUSCULAR | Status: AC
  Administered 2015-09-25: 10 mg

## 2015-09-25 NOTE — Progress Notes (Signed)
   Subjective:    Patient ID: Sydney ButteDeborah Cooper, female    DOB: 1955/09/16, 60 y.o.   MRN: [id number]  HPI    Review of Systems  All other systems reviewed and are negative.      Objective:   Physical Exam        Assessment & Plan:

## 2015-09-26 NOTE — Progress Notes (Signed)
Subjective:     Patient ID: Sydney ButteDeborah Derocher, female   DOB: 1955/10/21, 60 y.o.   MRN: [id number]  HPI patient presents admitting that she irritated the inside of her right foot and that she walked without her orthotics   Review of Systems     Objective:   Physical Exam Neurovascular status intact with tendinitis of the right posterior tibial tendon that shows good muscle strength with inflammation and fluid around the tendon    Assessment:     Tendinitis of the right posterior tibial    Plan:     Sheath injection administered 3 mg Kenalog 5 mill grams Xylocaine heat ice and supportive shoes and not going without her orthotics and reappoint 2 weeks and may ultimately require MRI if symptoms persist

## 2015-10-09 ENCOUNTER — Ambulatory Visit: Payer: Federal, State, Local not specified - PPO | Admitting: Podiatry

## 2015-10-17 ENCOUNTER — Ambulatory Visit (INDEPENDENT_AMBULATORY_CARE_PROVIDER_SITE_OTHER): Payer: Federal, State, Local not specified - PPO | Admitting: Podiatry

## 2015-10-17 DIAGNOSIS — M779 Enthesopathy, unspecified: Secondary | ICD-10-CM | POA: Diagnosis not present

## 2015-10-17 MED ORDER — MELOXICAM 15 MG PO TABS: 15.0000 mg | ORAL_TABLET | Freq: Every day | ORAL | 2 refills | Status: DC

## 2015-10-17 NOTE — Progress Notes (Signed)
Subjective:     Patient ID: Sydney ButteDeborah Cooper, female   DOB: July 22, 1955, 60 y.o.   MRN: [id number]  HPI patient states my ankle is still hurting me and it still swells especially at night   Review of Systems     Objective:   Physical Exam Neurovascular status intact muscle strength adequate with discomfort in the posterior tibial tendon right with no apparent complete dysfunction of the tendon    Assessment:     Posterior tibial tendinitis with possibility for tendon sheath tear still present    Plan:     Explained possibility of MRI and at this time I went ahead and I applied air fracture walker to completely immobilize. Continue ice and gradual return to shoes after 3 weeks of immobilization

## 2015-11-14 ENCOUNTER — Ambulatory Visit (INDEPENDENT_AMBULATORY_CARE_PROVIDER_SITE_OTHER): Payer: Federal, State, Local not specified - PPO | Admitting: Podiatry

## 2015-11-14 ENCOUNTER — Encounter: Payer: Self-pay | Admitting: Podiatry

## 2015-11-14 DIAGNOSIS — M779 Enthesopathy, unspecified: Secondary | ICD-10-CM | POA: Diagnosis not present

## 2015-11-14 DIAGNOSIS — T148XXA Other injury of unspecified body region, initial encounter: Secondary | ICD-10-CM

## 2015-11-14 NOTE — Progress Notes (Signed)
Subjective:     Patient ID: Sydney ButteDeborah Paolillo, female   DOB: March 02, 1955, 60 y.o.   MRN: [id number]  HPI patient presents stating she's getting a lot of pain still in her right ankle and the boot helps a little bit but it still is difficult for her to ambulate or do activity that she wants   Review of Systems     Objective:   Physical Exam Neurovascular status intact with continued edema in the right ankle medial side with fluid buildup noted around the posterior tibial as it comes under the medial malleolus    Assessment:     Continued chance for tear of the posterior tibial tendon right    Plan:     Do to one year history of this and failure so far to respond to immobilization orthotics injections I did send for MRI in order to see whether or not there may be an interstitial tear of the posterior tibial tendon which requires repair. We will see her back after MRI is complete

## 2015-12-06 ENCOUNTER — Ambulatory Visit
Admission: RE | Admit: 2015-12-06 | Discharge: 2015-12-06 | Disposition: A | Discharge status: Home / Self Care | Payer: Federal, State, Local not specified - PPO | Source: Ambulatory Visit | Attending: Podiatry | Admitting: Podiatry

## 2015-12-06 DIAGNOSIS — M19071 Primary osteoarthritis, right ankle and foot: Secondary | ICD-10-CM | POA: Diagnosis not present

## 2015-12-13 ENCOUNTER — Telehealth: Payer: Self-pay | Admitting: *Deleted

## 2015-12-13 ENCOUNTER — Telehealth: Payer: Self-pay | Admitting: Podiatry

## 2015-12-13 NOTE — Telephone Encounter (Signed)
Pt called for MRI results. Informed pt Dr. Charlsie Merlesegal would like to have pt come in to discuss the MRI results, transferred to schedulers for an appt.

## 2015-12-13 NOTE — Telephone Encounter (Signed)
Pt calling for her mri results she had it a week ago.

## 2015-12-13 NOTE — Telephone Encounter (Signed)
Would you show me a copy of the mri

## 2015-12-18 ENCOUNTER — Encounter: Payer: Self-pay | Admitting: Podiatry

## 2015-12-18 ENCOUNTER — Ambulatory Visit (INDEPENDENT_AMBULATORY_CARE_PROVIDER_SITE_OTHER): Payer: Federal, State, Local not specified - PPO | Admitting: Podiatry

## 2015-12-18 ENCOUNTER — Other Ambulatory Visit: Payer: Self-pay | Admitting: Podiatry

## 2015-12-18 DIAGNOSIS — M79672 Pain in left foot: Secondary | ICD-10-CM

## 2015-12-18 DIAGNOSIS — M779 Enthesopathy, unspecified: Secondary | ICD-10-CM

## 2015-12-18 MED ORDER — DICLOFENAC SODIUM 75 MG PO TBEC: 75.0000 mg | DELAYED_RELEASE_TABLET | Freq: Two times a day (BID) | ORAL | 2 refills | Status: DC

## 2015-12-19 NOTE — Progress Notes (Signed)
Subjective:     Patient ID: Sydney ButteDeborah Cooper, female   DOB: [birth year], 60 y.o.   MRN: [id number]  HPI patient states she's feeling somewhat better with her foot with discomfort still if she's been on it a lot but it seems the swelling has reduced   Review of Systems     Objective:   Physical Exam Neurovascular status intact with mild diminishment of discomfort around the tendon of the posterior tib right with inflammation still noted but reduced with patient utilizing ice and immobilization as needed    Assessment:     MRI that was currently negative with continued inflammation but improved    Plan:     Reviewed condition and recommended this patient utilize ice therapy anti-inflammatories and will be seen back for us to recheck again as needed and may still end up requiring procedure depending on response

## 2016-03-06 DIAGNOSIS — M1712 Unilateral primary osteoarthritis, left knee: Secondary | ICD-10-CM | POA: Diagnosis not present

## 2016-03-06 DIAGNOSIS — M19071 Primary osteoarthritis, right ankle and foot: Secondary | ICD-10-CM | POA: Diagnosis not present

## 2016-04-07 DIAGNOSIS — I10 Essential (primary) hypertension: Secondary | ICD-10-CM | POA: Diagnosis not present

## 2016-04-07 DIAGNOSIS — Z6841 Body Mass Index (BMI) 40.0 and over, adult: Secondary | ICD-10-CM | POA: Diagnosis not present

## 2016-04-07 DIAGNOSIS — R7303 Prediabetes: Secondary | ICD-10-CM | POA: Diagnosis not present

## 2016-05-12 DIAGNOSIS — Z111 Encounter for screening for respiratory tuberculosis: Secondary | ICD-10-CM | POA: Diagnosis not present

## 2016-05-12 DIAGNOSIS — Z0000 Encounter for general adult medical examination without abnormal findings: Secondary | ICD-10-CM | POA: Diagnosis not present

## 2016-05-12 DIAGNOSIS — R7303 Prediabetes: Secondary | ICD-10-CM | POA: Diagnosis not present

## 2016-05-15 DIAGNOSIS — Z0000 Encounter for general adult medical examination without abnormal findings: Secondary | ICD-10-CM | POA: Diagnosis not present

## 2016-05-15 DIAGNOSIS — Z13220 Encounter for screening for lipoid disorders: Secondary | ICD-10-CM | POA: Diagnosis not present

## 2016-05-20 DIAGNOSIS — Z01419 Encounter for gynecological examination (general) (routine) without abnormal findings: Secondary | ICD-10-CM | POA: Diagnosis not present

## 2016-05-20 DIAGNOSIS — Z3009 Encounter for other general counseling and advice on contraception: Secondary | ICD-10-CM | POA: Diagnosis not present

## 2016-05-28 DIAGNOSIS — Z01818 Encounter for other preprocedural examination: Secondary | ICD-10-CM | POA: Diagnosis not present

## 2016-06-04 DIAGNOSIS — K080 Exfoliation of teeth due to systemic causes: Secondary | ICD-10-CM | POA: Diagnosis not present

## 2016-07-06 DIAGNOSIS — K640 First degree hemorrhoids: Secondary | ICD-10-CM | POA: Diagnosis not present

## 2016-07-06 DIAGNOSIS — Z1211 Encounter for screening for malignant neoplasm of colon: Secondary | ICD-10-CM | POA: Diagnosis not present

## 2016-07-06 DIAGNOSIS — K5730 Diverticulosis of large intestine without perforation or abscess without bleeding: Secondary | ICD-10-CM | POA: Diagnosis not present

## 2016-08-18 DIAGNOSIS — E559 Vitamin D deficiency, unspecified: Secondary | ICD-10-CM | POA: Diagnosis not present

## 2016-09-30 ENCOUNTER — Inpatient Hospital Stay (HOSPITAL_BASED_OUTPATIENT_CLINIC_OR_DEPARTMENT_OTHER)
Admission: EM | Admit: 2016-09-30 | Discharge: 2016-10-03 | DRG: 287 | Disposition: A | Discharge status: Home / Self Care | Payer: Federal, State, Local not specified - PPO | Attending: Family Medicine | Admitting: Family Medicine

## 2016-09-30 ENCOUNTER — Emergency Department (HOSPITAL_BASED_OUTPATIENT_CLINIC_OR_DEPARTMENT_OTHER): Payer: Federal, State, Local not specified - PPO

## 2016-09-30 ENCOUNTER — Encounter (HOSPITAL_BASED_OUTPATIENT_CLINIC_OR_DEPARTMENT_OTHER): Payer: Self-pay

## 2016-09-30 DIAGNOSIS — R9439 Abnormal result of other cardiovascular function study: Secondary | ICD-10-CM | POA: Diagnosis not present

## 2016-09-30 DIAGNOSIS — R55 Syncope and collapse: Secondary | ICD-10-CM | POA: Diagnosis not present

## 2016-09-30 DIAGNOSIS — E876 Hypokalemia: Secondary | ICD-10-CM | POA: Diagnosis not present

## 2016-09-30 DIAGNOSIS — Z6841 Body Mass Index (BMI) 40.0 and over, adult: Secondary | ICD-10-CM

## 2016-09-30 DIAGNOSIS — G4733 Obstructive sleep apnea (adult) (pediatric): Secondary | ICD-10-CM | POA: Diagnosis not present

## 2016-09-30 DIAGNOSIS — R0602 Shortness of breath: Secondary | ICD-10-CM | POA: Diagnosis not present

## 2016-09-30 DIAGNOSIS — R7303 Prediabetes: Secondary | ICD-10-CM | POA: Diagnosis not present

## 2016-09-30 DIAGNOSIS — R001 Bradycardia, unspecified: Secondary | ICD-10-CM | POA: Diagnosis not present

## 2016-09-30 DIAGNOSIS — R911 Solitary pulmonary nodule: Secondary | ICD-10-CM

## 2016-09-30 DIAGNOSIS — I25110 Atherosclerotic heart disease of native coronary artery with unstable angina pectoris: Secondary | ICD-10-CM | POA: Diagnosis not present

## 2016-09-30 DIAGNOSIS — Z833 Family history of diabetes mellitus: Secondary | ICD-10-CM | POA: Diagnosis not present

## 2016-09-30 DIAGNOSIS — I959 Hypotension, unspecified: Secondary | ICD-10-CM | POA: Diagnosis not present

## 2016-09-30 DIAGNOSIS — I200 Unstable angina: Secondary | ICD-10-CM | POA: Diagnosis not present

## 2016-09-30 DIAGNOSIS — E669 Obesity, unspecified: Secondary | ICD-10-CM | POA: Diagnosis not present

## 2016-09-30 DIAGNOSIS — I10 Essential (primary) hypertension: Secondary | ICD-10-CM | POA: Diagnosis present

## 2016-09-30 DIAGNOSIS — Z8249 Family history of ischemic heart disease and other diseases of the circulatory system: Secondary | ICD-10-CM | POA: Diagnosis not present

## 2016-09-30 DIAGNOSIS — R079 Chest pain, unspecified: Secondary | ICD-10-CM

## 2016-09-30 DIAGNOSIS — R0789 Other chest pain: Secondary | ICD-10-CM | POA: Diagnosis not present

## 2016-09-30 LAB — CBC
HEMATOCRIT: 37.3 % (ref 36.0–46.0)
HEMOGLOBIN: 12.3 g/dL (ref 12.0–15.0)
MCH: 30.8 pg (ref 26.0–34.0)
MCHC: 33 g/dL (ref 30.0–36.0)
MCV: 93.3 fL (ref 78.0–100.0)
Platelets: 268 10*3/uL (ref 150–400)
RBC: 4 MIL/uL (ref 3.87–5.11)
RDW: 13.7 % (ref 11.5–15.5)
WBC: 6.3 10*3/uL (ref 4.0–10.5)

## 2016-09-30 LAB — COMPREHENSIVE METABOLIC PANEL
ALBUMIN: 3.5 g/dL (ref 3.5–5.0)
ALT: 22 U/L (ref 14–54)
ANION GAP: 5 (ref 5–15)
AST: 21 U/L (ref 15–41)
Alkaline Phosphatase: 52 U/L (ref 38–126)
BUN: 13 mg/dL (ref 6–20)
CO2: 27 mmol/L (ref 22–32)
Calcium: 8.8 mg/dL — ABNORMAL LOW (ref 8.9–10.3)
Chloride: 108 mmol/L (ref 101–111)
Creatinine, Ser: 0.83 mg/dL (ref 0.44–1.00)
GFR calc Af Amer: >60 mL/min (ref >60)
Glucose, Bld: 132 mg/dL — ABNORMAL HIGH (ref 65–99)
POTASSIUM: 3.4 mmol/L — AB (ref 3.5–5.1)
Sodium: 140 mmol/L (ref 135–145)
TOTAL PROTEIN: 6.9 g/dL (ref 6.5–8.1)
Total Bilirubin: 0.9 mg/dL (ref 0.3–1.2)

## 2016-09-30 LAB — TROPONIN I
Troponin I: <0.03 ng/mL (ref <0.03)
Troponin I: <0.03 ng/mL (ref <0.03)

## 2016-09-30 LAB — LIPID PANEL
CHOL/HDL RATIO: 4.1 ratio
CHOLESTEROL: 195 mg/dL (ref 0–200)
HDL: 47 mg/dL (ref >40)
LDL Cholesterol: 113 mg/dL — ABNORMAL HIGH (ref 0–99)
Triglycerides: 177 mg/dL — ABNORMAL HIGH (ref <150)
VLDL: 35 mg/dL (ref 0–40)

## 2016-09-30 LAB — D-DIMER, QUANTITATIVE (NOT AT ARMC): D DIMER QUANT: 0.68 ug{FEU}/mL — AB (ref 0.00–0.50)

## 2016-09-30 LAB — MAGNESIUM: Magnesium: 2.1 mg/dL (ref 1.7–2.4)

## 2016-09-30 LAB — HEMOGLOBIN A1C
HEMOGLOBIN A1C: 6.2 % — AB (ref 4.8–5.6)
MEAN PLASMA GLUCOSE: 131.24 mg/dL

## 2016-09-30 LAB — LIPASE, BLOOD: LIPASE: 26 U/L (ref 11–51)

## 2016-09-30 MED ORDER — ENOXAPARIN SODIUM 40 MG/0.4ML ~~LOC~~ SOLN: 40.0000 mg | SUBCUTANEOUS | Status: DC

## 2016-09-30 MED ORDER — MORPHINE SULFATE (PF) 2 MG/ML IV SOLN
2.0000 mg | INTRAVENOUS | Status: DC | PRN
  Administered 2016-09-30 – 2016-10-02 (×2): 2 mg via INTRAVENOUS
  Filled 2016-09-30 (×2): qty 1

## 2016-09-30 MED ORDER — IOPAMIDOL (ISOVUE-370) INJECTION 76%
100.0000 mL | Freq: Once | INTRAVENOUS | Status: AC | PRN
  Administered 2016-09-30: 100 mL via INTRAVENOUS

## 2016-09-30 MED ORDER — ACETAMINOPHEN 325 MG PO TABS: 650.0000 mg | ORAL_TABLET | ORAL | Status: DC | PRN

## 2016-09-30 MED ORDER — ONDANSETRON HCL 4 MG/2ML IJ SOLN
4.0000 mg | Freq: Four times a day (QID) | INTRAMUSCULAR | Status: DC | PRN
Start: 2016-09-30 — End: 2016-10-03

## 2016-09-30 MED ORDER — REGADENOSON 0.4 MG/5ML IV SOLN
0.4000 mg | Freq: Once | INTRAVENOUS | Status: DC
  Filled 2016-09-30: qty 5

## 2016-09-30 MED ORDER — IRBESARTAN 75 MG PO TABS
37.5000 mg | ORAL_TABLET | Freq: Every day | ORAL | Status: DC
  Administered 2016-10-01 – 2016-10-02 (×2): 37.5 mg via ORAL
  Filled 2016-09-30 (×3): qty 1

## 2016-09-30 MED ORDER — NITROGLYCERIN 0.4 MG SL SUBL
0.4000 mg | SUBLINGUAL_TABLET | SUBLINGUAL | Status: AC | PRN
  Administered 2016-09-30 (×3): 0.4 mg via SUBLINGUAL
  Filled 2016-09-30 (×2): qty 1

## 2016-09-30 MED ORDER — ASPIRIN 81 MG PO CHEW: 324.0000 mg | CHEWABLE_TABLET | Freq: Once | ORAL | Status: DC

## 2016-09-30 MED ORDER — POTASSIUM CHLORIDE CRYS ER 20 MEQ PO TBCR
40.0000 meq | EXTENDED_RELEASE_TABLET | Freq: Once | ORAL | Status: AC
  Administered 2016-09-30: 40 meq via ORAL
  Filled 2016-09-30: qty 2

## 2016-09-30 MED ORDER — NITROGLYCERIN 2 % TD OINT
1.0000 [in_us] | TOPICAL_OINTMENT | Freq: Once | TRANSDERMAL | Status: AC
  Administered 2016-09-30: 1 [in_us] via TOPICAL
  Filled 2016-09-30: qty 1

## 2016-09-30 MED ORDER — ASPIRIN 81 MG PO CHEW
324.0000 mg | CHEWABLE_TABLET | Freq: Once | ORAL | Status: AC
  Administered 2016-10-01: 324 mg via ORAL
  Filled 2016-09-30: qty 4

## 2016-09-30 MED ORDER — ASPIRIN 81 MG PO CHEW
324.0000 mg | CHEWABLE_TABLET | Freq: Once | ORAL | Status: AC
  Administered 2016-09-30: 324 mg via ORAL
  Filled 2016-09-30: qty 4

## 2016-09-30 NOTE — H&P (Addendum)
[facility] History and Physical  Karron Goens OZH:[id number] DOB: 1955/12/20 DOA: 09/30/2016  Referring physician:   PCP: Cain Saupe, MD   Chief Complaint: *Chest pain   HPI:  61 year old female with a history of hypertension, obstructive sleep apnea, prediabetes who presents with chest pain that woke her up from her sleep. Symptoms started at 3 AM this morning when she got up to go to the bathroom. She noticed soreness of the right side of the chest with radiation of the pain into her right arm. Pain lasted for about 2 hours. Patient felt short of breath when she got up in the morning and brought himself to the ED. Patient was evaluated at med Center at [facility]. Concerned about positive family history, brother (died of MI 32yo)  and sister (myocarditis in 30s). Workup in the ED showed troponin negative 2 D-dimer mildly elevated at 0.68. Obtained CTA of chest rule out PE. CTA was reassuring, no acute findings. She did have incidental finding of pulmonary nodule with recommendation of follow-up CT in 6-12 months.  Given her strong family history, age, and risk factors for heart disease, her HEART score is at least 4 and she has ongoing chest pain; she is admitted for observation     Review of Systems: negative for the following  Constitutional: Denies fever, chills, diaphoresis, appetite change and fatigue.  HEENT: Denies photophobia, eye pain, redness, hearing loss, ear pain, congestion, sore throat, rhinorrhea, sneezing, mouth sores, trouble swallowing, neck pain, neck stiffness and tinnitus.  Respiratory: Denies SOB, DOE, cough, chest tightness, and wheezing.  Cardiovascular: Positive for chest pain.  Gastrointestinal: Denies nausea, vomiting, abdominal pain, diarrhea, constipation, blood in stool and abdominal distention.  Genitourinary: Denies dysuria, urgency, frequency, hematuria, flank pain and difficulty urinating.  Musculoskeletal: Denies myalgias, back pain, joint  swelling, arthralgias and gait problem.  Skin: Denies pallor, rash and wound.  Neurological: Denies dizziness, seizures, syncope, weakness, light-headedness, numbness and headaches.  Hematological: Denies adenopathy. Easy bruising, personal or family bleeding history  Psychiatric/Behavioral: Denies suicidal ideation, mood changes, confusion, nervousness, sleep disturbance and agitation       Past Medical History:  Diagnosis Date  . Diabetes mellitus without complication (HCC)    prediabetic  . Diverticular disease   . Hypertension   . Sleep apnea      History reviewed. No pertinent surgical history.    Social History:  reports that she has never smoked. She has never used smokeless tobacco. She reports that she does not drink alcohol or use drugs.    No Known Allergies  Family History  Problem Relation Age of Onset  . Hypertension Mother   . COPD Mother   . Asthma Mother   . Diabetes Maternal Uncle          Prior to Admission medications   Medication Sig Start Date End Date Taking? Authorizing Provider  diclofenac (VOLTAREN) 75 MG EC tablet TAKE 1 TABLET(75 MG) BY MOUTH TWICE DAILY 12/19/15   Lenn Sink, DPM  hydrochlorothiazide (HYDRODIURIL) 25 MG tablet Take 25 mg by mouth daily.    [provider]  meloxicam (MOBIC) 15 MG tablet Take 1 tablet (15 mg total) by mouth daily. 10/17/15   Lenn Sink, DPM  Multiple Vitamin (MULTIVITAMIN) tablet Take 1 tablet by mouth.    [provider]  olmesartan (BENICAR) 40 MG tablet Take 40 mg by mouth daily.    [provider]     Physical Exam: Vitals:   09/30/16 1330 09/30/16  1400 09/30/16 1430 09/30/16 1547  BP: 99/71 122/76 130/74 138/61  Pulse: (!) 55 (!) 47 (!) 58 63  Resp: 17 17 19 18   Temp:    98 F (36.7 C)  TempSrc:    Oral  SpO2: 96% 97% 97% 100%  Weight:    115.8 kg (255 lb 6.4 oz)  Height:    5\' 7"  (1.702 m)        Vitals:   09/30/16 1330 09/30/16 1400 09/30/16 1430  09/30/16 1547  BP: 99/71 122/76 130/74 138/61  Pulse: (!) 55 (!) 47 (!) 58 63  Resp: 17 17 19 18   Temp:    98 F (36.7 C)  TempSrc:    Oral  SpO2: 96% 97% 97% 100%  Weight:    115.8 kg (255 lb 6.4 oz)  Height:    5\' 7"  (1.702 m)   Constitutional: NAD, calm, comfortable Eyes: PERRL, lids and conjunctivae normal ENMT: Mucous membranes are moist. Posterior pharynx clear of any exudate or lesions.Normal dentition.  Neck: normal, supple, no masses, no thyromegaly Pulmonary/Chest: Effort normal and breath sounds normal. She exhibits tenderness.  Cardiovascular: Regular rate and rhythm, no murmurs / rubs / gallops. No extremity edema. 2+ pedal pulses. No carotid bruits.  Abdomen: no tenderness, no masses palpated. No hepatosplenomegaly. Bowel sounds positive.  Musculoskeletal: no clubbing / cyanosis. No joint deformity upper and lower extremities. Good ROM, no contractures. Normal muscle tone.  Skin: no rashes, lesions, ulcers. No induration Neurologic: CN 2-12 grossly intact. Sensation intact, DTR normal. Strength 5/5 in all 4.  Psychiatric: Normal judgment and insight. Alert and oriented x 3. Normal mood.     Labs on Admission: I have personally reviewed following labs and imaging studies  CBC:  Recent Labs Lab 09/30/16 0730  WBC 6.3  HGB 12.3  HCT 37.3  MCV 93.3  PLT 268    Basic Metabolic Panel:  Recent Labs Lab 09/30/16 0730  NA 140  K 3.4*  CL 108  CO2 27  GLUCOSE 132*  BUN 13  CREATININE 0.83  CALCIUM 8.8*    GFR: Estimated Creatinine Clearance: 94.8 mL/min (by C-G formula based on SCr of 0.83 mg/dL).  Liver Function Tests:  Recent Labs Lab 09/30/16 0730  AST 21  ALT 22  ALKPHOS 52  BILITOT 0.9  PROT 6.9  ALBUMIN 3.5    Recent Labs Lab 09/30/16 0730  LIPASE 26   No results for input(s): AMMONIA in the last 168 hours.  Coagulation Profile: No results for input(s): INR, PROTIME in the last 168 hours.  Recent Labs  09/30/16 0730  DDIMER  0.68*    Cardiac Enzymes:  Recent Labs Lab 09/30/16 0730 09/30/16 1322  TROPONINI <0.03 <0.03    BNP (last 3 results) No results for input(s): PROBNP in the last 8760 hours.  HbA1C: No results for input(s): HGBA1C in the last 72 hours. No results found for: HGBA1C   CBG: No results for input(s): GLUCAP in the last 168 hours.  Lipid Profile: No results for input(s): CHOL, HDL, LDLCALC, TRIG, CHOLHDL, LDLDIRECT in the last 72 hours.  Thyroid Function Tests: No results for input(s): TSH, T4TOTAL, FREET4, T3FREE, THYROIDAB in the last 72 hours.  Anemia Panel: No results for input(s): VITAMINB12, FOLATE, FERRITIN, TIBC, IRON, RETICCTPCT in the last 72 hours.  Urine analysis:    Component Value Date/Time   COLORURINE YELLOW 04/28/2015 1615   APPEARANCEUR CLEAR 04/28/2015 1615   LABSPEC 1.021 04/28/2015 1615   PHURINE 6.5 04/28/2015 1615  GLUCOSEU NEGATIVE 04/28/2015 1615   HGBUR NEGATIVE 04/28/2015 1615   BILIRUBINUR NEGATIVE 04/28/2015 1615   KETONESUR NEGATIVE 04/28/2015 1615   PROTEINUR NEGATIVE 04/28/2015 1615   UROBILINOGEN 1.0 07/31/2014 1515   NITRITE NEGATIVE 04/28/2015 1615   LEUKOCYTESUR MODERATE (A) 04/28/2015 1615    Sepsis Labs: @LABRCNTIP (procalcitonin:4,lacticidven:4) )No results found for this or any previous visit (from the past 240 hour(s)).       Radiological Exams on Admission: Dg Chest 2 View  Result Date: 09/30/2016 CLINICAL DATA:  Lambert Mody right-sided chest pain. EXAM: CHEST  2 VIEW COMPARISON:  None. FINDINGS: The cardiomediastinal silhouette is mildly enlarged. Normal pulmonary vascularity. No focal consolidation, pleural effusion, or pneumothorax. No acute osseous abnormality. IMPRESSION: Mild cardiomegaly.  No active cardiopulmonary disease. Electronically Signed   By: Obie Dredge M.D.   On: 09/30/2016 08:33   Ct Angio Chest Pe W/cm &/or Wo Cm  Result Date: 09/30/2016 CLINICAL DATA:  Chest pain, shortness of breath. EXAM: CT  ANGIOGRAPHY CHEST WITH CONTRAST TECHNIQUE: Multidetector CT imaging of the chest was performed using the standard protocol during bolus administration of intravenous contrast. Multiplanar CT image reconstructions and MIPs were obtained to evaluate the vascular anatomy. CONTRAST:  100 mL of Isovue 370 intravenously. COMPARISON:  Radiographs of same day. FINDINGS: Cardiovascular: Satisfactory opacification of the pulmonary arteries to the segmental level. No evidence of pulmonary embolism. Mild cardiomegaly is noted. No pericardial effusion. Mediastinum/Nodes: No enlarged mediastinal, hilar, or axillary lymph nodes. Thyroid gland, trachea, and esophagus demonstrate no significant findings. Lungs/Pleura: 8 mm nodular density is seen in right upper lobe best seen on image number 43 of series 7. No pneumothorax or pleural effusion is noted. Upper Abdomen: No acute abnormality. Musculoskeletal: No chest wall abnormality. No acute or significant osseous findings. Review of the MIP images confirms the above findings. IMPRESSION: No definite evidence of pulmonary embolus. Mild cardiomegaly. 8 mm right upper lobe nodule. Non-contrast chest CT at 6-12 months is recommended. If the nodule is stable at time of repeat CT, then future CT at 18-24 months (from today's scan) is considered optional for low-risk patients, but is recommended for high-risk patients. This recommendation follows the consensus statement: Guidelines for Management of Incidental Pulmonary Nodules Detected on CT Images: From the Fleischner Society 2017; Radiology 2017; 284:228-243. Electronically Signed   By: Lupita Raider, M.D.   On: 09/30/2016 09:32   Dg Chest 2 View  Result Date: 09/30/2016 CLINICAL DATA:  Lambert Mody right-sided chest pain. EXAM: CHEST  2 VIEW COMPARISON:  None. FINDINGS: The cardiomediastinal silhouette is mildly enlarged. Normal pulmonary vascularity. No focal consolidation, pleural effusion, or pneumothorax. No acute osseous  abnormality. IMPRESSION: Mild cardiomegaly.  No active cardiopulmonary disease. Electronically Signed   By: Obie Dredge M.D.   On: 09/30/2016 08:33   Ct Angio Chest Pe W/cm &/or Wo Cm  Result Date: 09/30/2016 CLINICAL DATA:  Chest pain, shortness of breath. EXAM: CT ANGIOGRAPHY CHEST WITH CONTRAST TECHNIQUE: Multidetector CT imaging of the chest was performed using the standard protocol during bolus administration of intravenous contrast. Multiplanar CT image reconstructions and MIPs were obtained to evaluate the vascular anatomy. CONTRAST:  100 mL of Isovue 370 intravenously. COMPARISON:  Radiographs of same day. FINDINGS: Cardiovascular: Satisfactory opacification of the pulmonary arteries to the segmental level. No evidence of pulmonary embolism. Mild cardiomegaly is noted. No pericardial effusion. Mediastinum/Nodes: No enlarged mediastinal, hilar, or axillary lymph nodes. Thyroid gland, trachea, and esophagus demonstrate no significant findings. Lungs/Pleura: 8 mm nodular density is seen in right  upper lobe best seen on image number 43 of series 7. No pneumothorax or pleural effusion is noted. Upper Abdomen: No acute abnormality. Musculoskeletal: No chest wall abnormality. No acute or significant osseous findings. Review of the MIP images confirms the above findings. IMPRESSION: No definite evidence of pulmonary embolus. Mild cardiomegaly. 8 mm right upper lobe nodule. Non-contrast chest CT at 6-12 months is recommended. If the nodule is stable at time of repeat CT, then future CT at 18-24 months (from today's scan) is considered optional for low-risk patients, but is recommended for high-risk patients. This recommendation follows the consensus statement: Guidelines for Management of Incidental Pulmonary Nodules Detected on CT Images: From the Fleischner Society 2017; Radiology 2017; 284:228-243. Electronically Signed   By: Lupita RaiderJames  Green Jr, M.D.   On: 09/30/2016 09:32      EKG: Independently  reviewed. Sinus rhythm Borderline prolonged PR interval   Assessment/Plan Principal Problem:   Chest pain Atypical with reproducible symptoms on palpation of the chest wall Cardiac enzymes negative Admit to telemetry 2-D echo to rule out wall motion abnormalities Discussed with Trish in cardiology, she recommends stress test if her enzymes and EKGs remain negative and unchanged , NPO past midnight Cardiology will consult if stress test is positive cardiac enzymes become abnormal overnight  Prediabetes check hemoglobin A1c  Hypokalemia-replete  Pulmonary nodule -discussed CT findings with the patient,follow-up CT in 6-12 months          DVT prophylaxis: * Lovenox     Code Status Orders Full code        consults called:  Family Communication: Admission, patients condition and plan of care including tests being ordered have been discussed with the patient  who indicates understanding and agree with the plan and Code Status  Admission status: Observation  Disposition plan: Further plan will depend as patient's clinical course evolves and further radiologic and laboratory data become available. Likely home when stable   At the time of admission, it appears that the appropriate admission status for this patient is INPATIENT .Thisis judged to be reasonable and necessary in order to provide the required intensity of service to ensure the patient's safetygiven thepresenting symptoms, physical exam findings, and initial radiographic and laboratory data in the context of their chronic comorbidities.   Sydney OverlieNayana Gearldene Fiorenza MD [facility] Pager [phone]- [phone]  If 7PM-7AM, please contact night-coverage [email or web address] Password [email or web address]  09/30/2016, 5:10 PM

## 2016-09-30 NOTE — ED Triage Notes (Signed)
Pt reports waking up approx 3am to an onset of sharp right sided chest pain that radiates to the right side of her back. Pt tried to go back to sleep but pain was persistent. Pt also reports nausea and SOB upon exertion.

## 2016-09-30 NOTE — ED Notes (Signed)
Notified xray that pt is ready for transport 

## 2016-09-30 NOTE — ED Provider Notes (Signed)
[facility] Provider Note   CSN: [id number] Arrival date & time: 09/30/16  5784     History   Chief Complaint Chief Complaint  Patient presents with  . Chest Pain    HPI Sydney Cooper is a 61 y.o. female.  61yo F w/ PMH including HTN, OSA, prediabetes who p/w chest pain. She woke up at 3am to go to the bathroom and noticed soreness in her right chest; she thought she had slept wrong on that side. She went back to bed and woke up again at 5am with more intense right chest pain that is constant, squeezing and sharp, and radiates to her back. She had some SOB earlier this morning when getting dressed and walking into ED. Mild associated weakness and nausea, no vomiting. She did notice 2 days ago when she did her usual walk that she wasn't able to go as far as usual due to fatigue/SOB, but her walk yesterday was ok. No recent illness, fever, abdominal pain, urinary symptoms, or cough/cold symptoms. She has never had this pain before. No recent travel, leg swelling/pain, h/o cancer, h/o blood clots, or estrogen use.  FH notable for heart disease in brother (died of MI 76yo)  and sister (myocarditis in 30s).    Chest Pain      Past Medical History:  Diagnosis Date  . Diabetes mellitus without complication (HCC)    prediabetic  . Diverticular disease   . Hypertension   . Sleep apnea     There are no active problems to display for this patient.   History reviewed. No pertinent surgical history.  OB History    No data available       Home Medications    Prior to Admission medications   Medication Sig Start Date End Date Taking? Authorizing Provider  diclofenac (VOLTAREN) 75 MG EC tablet TAKE 1 TABLET(75 MG) BY MOUTH TWICE DAILY 12/19/15   Lenn Sink, DPM  hydrochlorothiazide (HYDRODIURIL) 25 MG tablet Take 25 mg by mouth daily.    [provider]  meloxicam (MOBIC) 15 MG tablet Take 1 tablet (15 mg total) by mouth daily. 10/17/15   Lenn Sink, DPM  Multiple Vitamin (MULTIVITAMIN) tablet Take 1 tablet by mouth.    [provider]  olmesartan (BENICAR) 40 MG tablet Take 40 mg by mouth daily.    [provider]    Family History Family History  Problem Relation Age of Onset  . Hypertension Mother   . COPD Mother   . Asthma Mother   . Diabetes Maternal Uncle     Social History Social History  Substance Use Topics  . Smoking status: Never Smoker  . Smokeless tobacco: Never Used  . Alcohol use No     Allergies   Patient has no known allergies.   Review of Systems Review of Systems  Cardiovascular: Positive for chest pain.     Physical Exam Updated Vital Signs BP (!) 129/58 (BP Location: Left Arm)   Pulse (!) 55   Temp 98 F (36.7 C) (Oral)   Resp 18   Ht 5\' 7"  (1.702 m)   Wt 113.4 kg (250 lb)   SpO2 96%   BMI 39.16 kg/m   Physical Exam  Constitutional: She is oriented to person, place, and time. She appears well-developed and well-nourished. No distress.  HENT:  Head: Normocephalic and atraumatic.  Moist mucous membranes  Eyes: Pupils are equal, round, and reactive to light. Conjunctivae are normal.  Neck: Neck  supple.  Cardiovascular: Normal rate, regular rhythm and normal heart sounds.   No murmur heard. Pulmonary/Chest: Effort normal and breath sounds normal. She exhibits tenderness.  Abdominal: Soft. Bowel sounds are normal. She exhibits no distension. There is no tenderness.  Musculoskeletal: She exhibits no edema.  Neurological: She is alert and oriented to person, place, and time.  Fluent speech  Skin: Skin is warm and dry.  Psychiatric: She has a normal mood and affect. Judgment normal.  Nursing note and vitals reviewed.    ED Treatments / Results  Labs (all labs ordered are listed, but only abnormal results are displayed) Labs Reviewed  COMPREHENSIVE METABOLIC PANEL - Abnormal; Notable for the following:       Result Value   Potassium 3.4 (*)    Glucose, Bld  132 (*)    Calcium 8.8 (*)    All other components within normal limits  D-DIMER, QUANTITATIVE (NOT AT [facility]) - Abnormal; Notable for the following:    D-Dimer, Quant 0.68 (*)    All other components within normal limits  CBC  TROPONIN I  LIPASE, BLOOD    EKG  EKG Interpretation  Date/Time:  Wednesday September 30 2016 06:55:37 EDT Ventricular Rate:  59 PR Interval:    QRS Duration: 103 QT Interval:  437 QTC Calculation: 433 R Axis:   8 Text Interpretation:  Sinus rhythm Borderline prolonged PR interval No previous ECGs available Confirmed by Frederick Peers [phone]) on 09/30/2016 7:04:26 AM       Radiology Dg Chest 2 View  Result Date: 09/30/2016 CLINICAL DATA:  Lambert Mody right-sided chest pain. EXAM: CHEST  2 VIEW COMPARISON:  None. FINDINGS: The cardiomediastinal silhouette is mildly enlarged. Normal pulmonary vascularity. No focal consolidation, pleural effusion, or pneumothorax. No acute osseous abnormality. IMPRESSION: Mild cardiomegaly.  No active cardiopulmonary disease. Electronically Signed   By: Obie Dredge M.D.   On: 09/30/2016 08:33   Ct Angio Chest Pe W/cm &/or Wo Cm  Result Date: 09/30/2016 CLINICAL DATA:  Chest pain, shortness of breath. EXAM: CT ANGIOGRAPHY CHEST WITH CONTRAST TECHNIQUE: Multidetector CT imaging of the chest was performed using the standard protocol during bolus administration of intravenous contrast. Multiplanar CT image reconstructions and MIPs were obtained to evaluate the vascular anatomy. CONTRAST:  100 mL of Isovue 370 intravenously. COMPARISON:  Radiographs of same day. FINDINGS: Cardiovascular: Satisfactory opacification of the pulmonary arteries to the segmental level. No evidence of pulmonary embolism. Mild cardiomegaly is noted. No pericardial effusion. Mediastinum/Nodes: No enlarged mediastinal, hilar, or axillary lymph nodes. Thyroid gland, trachea, and esophagus demonstrate no significant findings. Lungs/Pleura: 8 mm nodular density is seen in  right upper lobe best seen on image number 43 of series 7. No pneumothorax or pleural effusion is noted. Upper Abdomen: No acute abnormality. Musculoskeletal: No chest wall abnormality. No acute or significant osseous findings. Review of the MIP images confirms the above findings. IMPRESSION: No definite evidence of pulmonary embolus. Mild cardiomegaly. 8 mm right upper lobe nodule. Non-contrast chest CT at 6-12 months is recommended. If the nodule is stable at time of repeat CT, then future CT at 18-24 months (from today's scan) is considered optional for low-risk patients, but is recommended for high-risk patients. This recommendation follows the consensus statement: Guidelines for Management of Incidental Pulmonary Nodules Detected on CT Images: From the Fleischner Society 2017; Radiology 2017; 284:228-243. Electronically Signed   By: Lupita Raider, M.D.   On: 09/30/2016 09:32    Procedures Procedures (including critical care time)  Medications Ordered  in ED Medications  aspirin chewable tablet 324 mg (324 mg Oral Given 09/30/16 0736)  nitroGLYCERIN (NITROSTAT) SL tablet 0.4 mg (0.4 mg Sublingual Given 09/30/16 1047)  iopamidol (ISOVUE-370) 76 % injection 100 mL (100 mLs Intravenous Contrast Given 09/30/16 0854)  nitroGLYCERIN (NITROGLYN) 2 % ointment 1 inch (1 inch Topical Given 09/30/16 1106)     Initial Impression / Assessment and Plan / ED Course  I have reviewed the triage vital signs and the nursing notes.  Pertinent labs & imaging results that were available during my care of the patient were reviewed by me and considered in my medical decision making (see chart for details).     PT w/ Right-sided chest pain that began while in bed and has been persistent this morning. She was nontoxic on exam with reassuring vital signs. EKG without evidence of acute ischemia. No chest or abdominal tenderness on exam. Obtained above labs as well as chest x-ray. Gave the patient aspirin and  nitroglycerin.  Initial troponin negative. D-dimer mildly elevated at 0.68. Obtained CTA of chest rule out PE. CTA was reassuring, no acute findings. She did have incidental finding of pulmonary nodule with recommendation of follow-up CT in 6-12 months. I discussed this finding with the patient and need for follow-up. Her lab work is otherwise unremarkable.  Given her strong family history, age, and risk factors for heart disease, her HEART score is at least 4 and she has ongoing chest pain; she has responded to NTG therefore have ordered nitropaste. I discussed admission w/ [facility], Dr. Konrad DoloresMerrell, appreciate his assistance. [facility].   Final Clinical Impressions(s) / ED Diagnoses   Final diagnoses:  Pulmonary nodule  Chest pain, unspecified type    New Prescriptions New Prescriptions   No medications on file     Little, Ambrose Finlandachel Morgan, MD 09/30/16 1139

## 2016-09-30 NOTE — ED Notes (Signed)
Patient transported to X-ray 

## 2016-09-30 NOTE — ED Notes (Signed)
ED Provider at bedside. 

## 2016-10-01 ENCOUNTER — Observation Stay (HOSPITAL_BASED_OUTPATIENT_CLINIC_OR_DEPARTMENT_OTHER): Payer: Federal, State, Local not specified - PPO

## 2016-10-01 DIAGNOSIS — R7303 Prediabetes: Secondary | ICD-10-CM | POA: Diagnosis not present

## 2016-10-01 DIAGNOSIS — R9439 Abnormal result of other cardiovascular function study: Secondary | ICD-10-CM | POA: Diagnosis not present

## 2016-10-01 DIAGNOSIS — R079 Chest pain, unspecified: Secondary | ICD-10-CM | POA: Diagnosis not present

## 2016-10-01 DIAGNOSIS — R911 Solitary pulmonary nodule: Secondary | ICD-10-CM | POA: Diagnosis not present

## 2016-10-01 DIAGNOSIS — G4733 Obstructive sleep apnea (adult) (pediatric): Secondary | ICD-10-CM | POA: Diagnosis not present

## 2016-10-01 DIAGNOSIS — Z6841 Body Mass Index (BMI) 40.0 and over, adult: Secondary | ICD-10-CM | POA: Diagnosis not present

## 2016-10-01 DIAGNOSIS — R001 Bradycardia, unspecified: Secondary | ICD-10-CM | POA: Diagnosis not present

## 2016-10-01 DIAGNOSIS — R072 Precordial pain: Secondary | ICD-10-CM

## 2016-10-01 DIAGNOSIS — I25110 Atherosclerotic heart disease of native coronary artery with unstable angina pectoris: Secondary | ICD-10-CM | POA: Diagnosis not present

## 2016-10-01 LAB — COMPREHENSIVE METABOLIC PANEL
ALT: 17 U/L (ref 14–54)
AST: 21 U/L (ref 15–41)
Albumin: 3.2 g/dL — ABNORMAL LOW (ref 3.5–5.0)
Alkaline Phosphatase: 49 U/L (ref 38–126)
Anion gap: 7 (ref 5–15)
BUN: 11 mg/dL (ref 6–20)
CO2: 27 mmol/L (ref 22–32)
Calcium: 8.7 mg/dL — ABNORMAL LOW (ref 8.9–10.3)
Chloride: 108 mmol/L (ref 101–111)
Creatinine, Ser: 0.8 mg/dL (ref 0.44–1.00)
GFR calc Af Amer: >60 mL/min (ref >60)
GFR calc non Af Amer: >60 mL/min (ref >60)
Glucose, Bld: 121 mg/dL — ABNORMAL HIGH (ref 65–99)
Potassium: 3.7 mmol/L (ref 3.5–5.1)
Sodium: 142 mmol/L (ref 135–145)
Total Bilirubin: 1.1 mg/dL (ref 0.3–1.2)
Total Protein: 6.5 g/dL (ref 6.5–8.1)

## 2016-10-01 LAB — ECHOCARDIOGRAM COMPLETE
Height: 67 in
Weight: 4086.4 oz

## 2016-10-01 LAB — TROPONIN I: Troponin I: <0.03 ng/mL (ref <0.03)

## 2016-10-01 LAB — HIV ANTIBODY (ROUTINE TESTING W REFLEX): HIV Screen 4th Generation wRfx: NONREACTIVE

## 2016-10-01 MED ORDER — TECHNETIUM TC 99M TETROFOSMIN IV KIT
30.0000 | PACK | Freq: Once | INTRAVENOUS | Status: AC | PRN
  Administered 2016-10-01: 30 via INTRAVENOUS

## 2016-10-01 MED ORDER — TECHNETIUM TC 99M TETROFOSMIN IV KIT
10.0000 | PACK | Freq: Once | INTRAVENOUS | Status: AC | PRN
  Administered 2016-10-01: 10 via INTRAVENOUS

## 2016-10-01 NOTE — Progress Notes (Signed)
  Echocardiogram 2D Echocardiogram has been performed.  Janalyn HarderWest, Jennavie Martinek R 10/01/2016, 1:26 PM

## 2016-10-01 NOTE — Progress Notes (Signed)
Patient presented for exercise Myoview. Tolerated procedure well. Pending final stress imaging result.  

## 2016-10-01 NOTE — CV Procedure (Signed)
2D echo attempted but patient in nuc med 940a

## 2016-10-01 NOTE — Progress Notes (Signed)
PROGRESS NOTE    Sydney Cooper  ZOX:[id number]  DOB: 1955-05-16  DOA: 09/30/2016 PCP: Cain Saupe, MD   Brief Admission Hx: 61 year old female with a history of hypertension, obstructive sleep apnea, prediabetes who presents with chest pain that woke her up from her sleep. Symptoms started at 3 AM this morning when she got up to go to the bathroom.   MDM/Assessment & Plan:     Chest pain Atypical with reproducible symptoms on palpation of the chest wall Cardiac enzymes negative Admit to telemetry 2-D echo normal EF and ruled out wall motion abnormalities Discussed with Trish in cardiology, she recommends stress test if her enzymes and EKGs remain negative and unchanged ,  STILL WAITING FOR NUCLEAR TEST RESULTS Cardiology will consult if stress test is positive   Prediabetes hemoglobin A1c 6.2  Hypokalemia-repleted  Pulmonary nodule -discussed CT findings with the patient, follow-up CT in 6-12 months      Subjective: Pt reports that pain is 0/10 completely resolved.   Objective: Vitals:   09/30/16 2008 10/01/16 0947 10/01/16 1013 10/01/16 1300  BP:  122/60 (!) 181/75 130/66  Pulse: (!) 59     Resp: 18   20  Temp: 98.1 F (36.7 C)   97.9 F (36.6 C)  TempSrc: Oral   Oral  SpO2: 98%   100%  Weight:      Height:        Intake/Output Summary (Last 24 hours) at 10/01/16 1854 Last data filed at 10/01/16 1100  Gross per 24 hour  Intake              600 ml  Output                0 ml  Net              600 ml   Filed Weights   09/30/16 0656 09/30/16 1547  Weight: 113.4 kg (250 lb) 115.8 kg (255 lb 6.4 oz)     REVIEW OF SYSTEMS  As per history otherwise all reviewed and reported negative  Exam:  General exam: awake, alert, NAD, cooperative.   Respiratory system: Clear. No increased work of breathing. Cardiovascular system: S1 & S2 heard, RRR. No JVD, murmurs, gallops, clicks or pedal edema. Gastrointestinal system: Abdomen is nondistended, soft and  nontender. Normal bowel sounds heard. Central nervous system: Alert and oriented. No focal neurological deficits. Extremities: no CCE.  Data Reviewed: Basic Metabolic Panel:  Recent Labs Lab 09/30/16 0730 09/30/16 1703 10/01/16 0217  NA 140  --  142  K 3.4*  --  3.7  CL 108  --  108  CO2 27  --  27  GLUCOSE 132*  --  121*  BUN 13  --  11  CREATININE 0.83  --  0.80  CALCIUM 8.8*  --  8.7*  MG  --  2.1  --    Liver Function Tests:  Recent Labs Lab 09/30/16 0730 10/01/16 0217  AST 21 21  ALT 22 17  ALKPHOS 52 49  BILITOT 0.9 1.1  PROT 6.9 6.5  ALBUMIN 3.5 3.2*    Recent Labs Lab 09/30/16 0730  LIPASE 26   No results for input(s): AMMONIA in the last 168 hours. CBC:  Recent Labs Lab 09/30/16 0730  WBC 6.3  HGB 12.3  HCT 37.3  MCV 93.3  PLT 268   Cardiac Enzymes:  Recent Labs Lab 09/30/16 0730 09/30/16 1322 09/30/16 1703 09/30/16 2336  TROPONINI <0.03 <0.03 <0.03 <0.03  CBG (last 3)  No results for input(s): GLUCAP in the last 72 hours. No results found for this or any previous visit (from the past 240 hour(s)).   Studies: Dg Chest 2 View  Result Date: 09/30/2016 CLINICAL DATA:  Lambert ModySharp right-sided chest pain. EXAM: CHEST  2 VIEW COMPARISON:  None. FINDINGS: The cardiomediastinal silhouette is mildly enlarged. Normal pulmonary vascularity. No focal consolidation, pleural effusion, or pneumothorax. No acute osseous abnormality. IMPRESSION: Mild cardiomegaly.  No active cardiopulmonary disease. Electronically Signed   By: Obie DredgeWilliam T Derry M.D.   On: 09/30/2016 08:33   Ct Angio Chest Pe W/cm &/or Wo Cm  Result Date: 09/30/2016 CLINICAL DATA:  Chest pain, shortness of breath. EXAM: CT ANGIOGRAPHY CHEST WITH CONTRAST TECHNIQUE: Multidetector CT imaging of the chest was performed using the standard protocol during bolus administration of intravenous contrast. Multiplanar CT image reconstructions and MIPs were obtained to evaluate the vascular anatomy.  CONTRAST:  100 mL of Isovue 370 intravenously. COMPARISON:  Radiographs of same day. FINDINGS: Cardiovascular: Satisfactory opacification of the pulmonary arteries to the segmental level. No evidence of pulmonary embolism. Mild cardiomegaly is noted. No pericardial effusion. Mediastinum/Nodes: No enlarged mediastinal, hilar, or axillary lymph nodes. Thyroid gland, trachea, and esophagus demonstrate no significant findings. Lungs/Pleura: 8 mm nodular density is seen in right upper lobe best seen on image number 43 of series 7. No pneumothorax or pleural effusion is noted. Upper Abdomen: No acute abnormality. Musculoskeletal: No chest wall abnormality. No acute or significant osseous findings. Review of the MIP images confirms the above findings. IMPRESSION: No definite evidence of pulmonary embolus. Mild cardiomegaly. 8 mm right upper lobe nodule. Non-contrast chest CT at 6-12 months is recommended. If the nodule is stable at time of repeat CT, then future CT at 18-24 months (from today's scan) is considered optional for low-risk patients, but is recommended for high-risk patients. This recommendation follows the consensus statement: Guidelines for Management of Incidental Pulmonary Nodules Detected on CT Images: From the Fleischner Society 2017; Radiology 2017; 284:228-243. Electronically Signed   By: Lupita RaiderJames  Green Jr, M.D.   On: 09/30/2016 09:32     Scheduled Meds: . enoxaparin (LOVENOX) injection  40 mg Subcutaneous Q24H  . irbesartan  37.5 mg Oral Daily  . regadenoson  0.4 mg Intravenous Once   Continuous Infusions:  Principal Problem:   Chest pain Active Problems:   Pulmonary nodule   Time spent:   Standley Dakinslanford Johnson, MD, FAAFP [facility] Pager [phone]  If 7PM-7AM, please contact night-coverage [email or web address] Password [email or web address] 10/01/2016, 6:54 PM    LOS: 0 days

## 2016-10-01 NOTE — Progress Notes (Signed)
10/01/2016 7:00 PM  Still waiting for stress test results.  Pt will have to stay another night because I can't discharge without the results.   Sydney Cooper. Karlei Waldo MD

## 2016-10-01 NOTE — Progress Notes (Signed)
10/01/2016 6:20 PM  I updated patient that i'm still waiting on results of nuclear stress test.  I can't discharge until I have those results.  Maryln Manuel. Kilah Drahos MD

## 2016-10-02 ENCOUNTER — Inpatient Hospital Stay (HOSPITAL_COMMUNITY): Payer: Federal, State, Local not specified - PPO

## 2016-10-02 ENCOUNTER — Encounter (HOSPITAL_COMMUNITY)
Admission: EM | Disposition: A | Discharge status: Home / Self Care | Payer: Self-pay | Source: Home / Self Care | Attending: Family Medicine

## 2016-10-02 ENCOUNTER — Encounter (HOSPITAL_COMMUNITY): Payer: Self-pay | Admitting: Family Medicine

## 2016-10-02 DIAGNOSIS — I10 Essential (primary) hypertension: Secondary | ICD-10-CM | POA: Diagnosis not present

## 2016-10-02 DIAGNOSIS — Z833 Family history of diabetes mellitus: Secondary | ICD-10-CM | POA: Diagnosis not present

## 2016-10-02 DIAGNOSIS — R911 Solitary pulmonary nodule: Secondary | ICD-10-CM | POA: Diagnosis not present

## 2016-10-02 DIAGNOSIS — G4733 Obstructive sleep apnea (adult) (pediatric): Secondary | ICD-10-CM | POA: Diagnosis present

## 2016-10-02 DIAGNOSIS — Z6841 Body Mass Index (BMI) 40.0 and over, adult: Secondary | ICD-10-CM | POA: Diagnosis not present

## 2016-10-02 DIAGNOSIS — R079 Chest pain, unspecified: Secondary | ICD-10-CM | POA: Diagnosis not present

## 2016-10-02 DIAGNOSIS — R001 Bradycardia, unspecified: Secondary | ICD-10-CM | POA: Diagnosis not present

## 2016-10-02 DIAGNOSIS — R9439 Abnormal result of other cardiovascular function study: Secondary | ICD-10-CM

## 2016-10-02 DIAGNOSIS — R0789 Other chest pain: Secondary | ICD-10-CM | POA: Diagnosis present

## 2016-10-02 DIAGNOSIS — R072 Precordial pain: Secondary | ICD-10-CM | POA: Diagnosis not present

## 2016-10-02 DIAGNOSIS — I2510 Atherosclerotic heart disease of native coronary artery without angina pectoris: Secondary | ICD-10-CM | POA: Diagnosis not present

## 2016-10-02 DIAGNOSIS — Z8249 Family history of ischemic heart disease and other diseases of the circulatory system: Secondary | ICD-10-CM | POA: Diagnosis not present

## 2016-10-02 DIAGNOSIS — N9489 Other specified conditions associated with female genital organs and menstrual cycle: Secondary | ICD-10-CM | POA: Diagnosis not present

## 2016-10-02 DIAGNOSIS — R55 Syncope and collapse: Secondary | ICD-10-CM | POA: Diagnosis not present

## 2016-10-02 DIAGNOSIS — R7309 Other abnormal glucose: Secondary | ICD-10-CM | POA: Diagnosis not present

## 2016-10-02 DIAGNOSIS — I25110 Atherosclerotic heart disease of native coronary artery with unstable angina pectoris: Secondary | ICD-10-CM | POA: Diagnosis present

## 2016-10-02 DIAGNOSIS — I959 Hypotension, unspecified: Secondary | ICD-10-CM | POA: Diagnosis not present

## 2016-10-02 DIAGNOSIS — E669 Obesity, unspecified: Secondary | ICD-10-CM | POA: Diagnosis present

## 2016-10-02 DIAGNOSIS — E876 Hypokalemia: Secondary | ICD-10-CM | POA: Diagnosis present

## 2016-10-02 DIAGNOSIS — R7303 Prediabetes: Secondary | ICD-10-CM | POA: Diagnosis present

## 2016-10-02 HISTORY — PX: LEFT HEART CATH AND CORONARY ANGIOGRAPHY: CATH118249

## 2016-10-02 LAB — NM MYOCAR MULTI W/SPECT W/WALL MOTION / EF
CSEPED: 8 min
CSEPEDS: 31 s
CSEPHR: 86 %
CSEPPHR: 139 {beats}/min
Estimated workload: 10.1 METS
LV sys vol: 50 mL
LVDIAVOL: 121 mL (ref 46–106)
MPHR: 160 {beats}/min
RPE: 15
Rest HR: 56 {beats}/min
TID: 1.41

## 2016-10-02 LAB — CBC
HCT: 39.5 % (ref 36.0–46.0)
Hemoglobin: 12.5 g/dL (ref 12.0–15.0)
MCH: 29.9 pg (ref 26.0–34.0)
MCHC: 31.6 g/dL (ref 30.0–36.0)
MCV: 94.5 fL (ref 78.0–100.0)
Platelets: 250 10*3/uL (ref 150–400)
RBC: 4.18 MIL/uL (ref 3.87–5.11)
RDW: 13.8 % (ref 11.5–15.5)
WBC: 8.4 10*3/uL (ref 4.0–10.5)

## 2016-10-02 LAB — PROTIME-INR
INR: 0.95
PROTHROMBIN TIME: 12.6 s (ref 11.4–15.2)

## 2016-10-02 SURGERY — LEFT HEART CATH AND CORONARY ANGIOGRAPHY
Anesthesia: LOCAL

## 2016-10-02 MED ORDER — ATORVASTATIN CALCIUM 80 MG PO TABS
80.0000 mg | ORAL_TABLET | ORAL | Status: AC
  Administered 2016-10-02: 80 mg via ORAL
  Filled 2016-10-02: qty 1

## 2016-10-02 MED ORDER — ASPIRIN 81 MG PO CHEW
81.0000 mg | CHEWABLE_TABLET | ORAL | Status: DC
  Filled 2016-10-02: qty 1

## 2016-10-02 MED ORDER — HEPARIN (PORCINE) IN NACL 2-0.9 UNIT/ML-% IJ SOLN
INTRAMUSCULAR | Status: AC | PRN
  Administered 2016-10-02: 1000 mL

## 2016-10-02 MED ORDER — FENTANYL CITRATE (PF) 100 MCG/2ML IJ SOLN
INTRAMUSCULAR | Status: AC
  Filled 2016-10-02: qty 2

## 2016-10-02 MED ORDER — DOPAMINE-DEXTROSE 3.2-5 MG/ML-% IV SOLN
0.0000 ug/kg/min | INTRAVENOUS | Status: DC
  Administered 2016-10-02: 5 ug/kg/min via INTRAVENOUS

## 2016-10-02 MED ORDER — SODIUM CHLORIDE 0.9% FLUSH: 3.0000 mL | INTRAVENOUS | Status: DC | PRN

## 2016-10-02 MED ORDER — SODIUM CHLORIDE 0.9 % IV SOLN: INTRAVENOUS | Status: DC

## 2016-10-02 MED ORDER — SODIUM CHLORIDE 0.9% FLUSH
3.0000 mL | Freq: Two times a day (BID) | INTRAVENOUS | Status: DC
  Administered 2016-10-02: 3 mL via INTRAVENOUS

## 2016-10-02 MED ORDER — LIDOCAINE HCL 2 % IJ SOLN
INTRAMUSCULAR | Status: DC | PRN
  Administered 2016-10-02: 10 mL

## 2016-10-02 MED ORDER — SODIUM CHLORIDE 0.9 % IV BOLUS (SEPSIS)
500.0000 mL | Freq: Once | INTRAVENOUS | Status: AC
  Administered 2016-10-02: 500 mL via INTRAVENOUS

## 2016-10-02 MED ORDER — ASPIRIN 81 MG PO CHEW
81.0000 mg | CHEWABLE_TABLET | ORAL | Status: AC
  Administered 2016-10-02: 81 mg via ORAL

## 2016-10-02 MED ORDER — IOPAMIDOL (ISOVUE-370) INJECTION 76%
INTRAVENOUS | Status: DC | PRN
  Administered 2016-10-02: 35 mL via INTRA_ARTERIAL

## 2016-10-02 MED ORDER — DOPAMINE-DEXTROSE 3.2-5 MG/ML-% IV SOLN
INTRAVENOUS | Status: AC
  Administered 2016-10-02: 5 ug/kg/min via INTRAVENOUS
  Filled 2016-10-02: qty 250

## 2016-10-02 MED ORDER — ATROPINE SULFATE 1 MG/ML IJ SOLN
0.5000 mg | Freq: Once | INTRAMUSCULAR | Status: AC
  Administered 2016-10-02: 0.5 mg via INTRAVENOUS

## 2016-10-02 MED ORDER — SODIUM CHLORIDE 0.9 % IV SOLN: 250.0000 mL | INTRAVENOUS | Status: DC | PRN

## 2016-10-02 MED ORDER — MIDAZOLAM HCL 2 MG/2ML IJ SOLN
INTRAMUSCULAR | Status: DC | PRN
  Administered 2016-10-02 (×2): 1 mg via INTRAVENOUS

## 2016-10-02 MED ORDER — ATROPINE SULFATE 1 MG/10ML IJ SOSY
PREFILLED_SYRINGE | INTRAMUSCULAR | Status: AC
  Administered 2016-10-02: 0.5 mg via INTRAVENOUS
  Filled 2016-10-02: qty 10

## 2016-10-02 MED ORDER — FENTANYL CITRATE (PF) 100 MCG/2ML IJ SOLN
INTRAMUSCULAR | Status: DC | PRN
  Administered 2016-10-02 (×2): 25 ug via INTRAVENOUS

## 2016-10-02 MED ORDER — LIDOCAINE HCL (PF) 1 % IJ SOLN: INTRAMUSCULAR | Status: DC | PRN

## 2016-10-02 MED ORDER — LIDOCAINE HCL 2 % IJ SOLN
INTRAMUSCULAR | Status: AC
  Filled 2016-10-02: qty 10

## 2016-10-02 MED ORDER — ATROPINE SULFATE 1 MG/10ML IJ SOSY
0.5000 mg | PREFILLED_SYRINGE | Freq: Once | INTRAMUSCULAR | Status: AC
  Administered 2016-10-02: 0.5 mg via INTRAVENOUS

## 2016-10-02 MED ORDER — SODIUM CHLORIDE 0.9 % IV SOLN
INTRAVENOUS | Status: DC
  Administered 2016-10-02: 13:00:00 via INTRAVENOUS

## 2016-10-02 MED ORDER — SODIUM CHLORIDE 0.9 % IV SOLN: INTRAVENOUS | Status: AC

## 2016-10-02 MED ORDER — ATORVASTATIN CALCIUM 80 MG PO TABS: 80.0000 mg | ORAL_TABLET | Freq: Every day | ORAL | Status: DC

## 2016-10-02 MED ORDER — HEPARIN (PORCINE) IN NACL 2-0.9 UNIT/ML-% IJ SOLN
INTRAMUSCULAR | Status: AC
  Filled 2016-10-02: qty 1000

## 2016-10-02 MED ORDER — IOPAMIDOL (ISOVUE-370) INJECTION 76%
INTRAVENOUS | Status: AC
  Filled 2016-10-02: qty 100

## 2016-10-02 MED ORDER — MIDAZOLAM HCL 2 MG/2ML IJ SOLN
INTRAMUSCULAR | Status: AC
  Filled 2016-10-02: qty 2

## 2016-10-02 MED ORDER — SODIUM CHLORIDE 0.9% FLUSH: 3.0000 mL | Freq: Two times a day (BID) | INTRAVENOUS | Status: DC

## 2016-10-02 SURGICAL SUPPLY — 7 items
CATH INFINITI 5FR MULTPACK ANG (CATHETERS) ×2 IMPLANT
HOVERMATT SINGLE USE (MISCELLANEOUS) ×2 IMPLANT
KIT HEART LEFT (KITS) ×2 IMPLANT
PACK CARDIAC CATHETERIZATION (CUSTOM PROCEDURE TRAY) ×2 IMPLANT
SHEATH PINNACLE 5F 10CM (SHEATH) ×2 IMPLANT
TRANSDUCER W/STOPCOCK (MISCELLANEOUS) ×2 IMPLANT
WIRE EMERALD 3MM-J .035X150CM (WIRE) ×2 IMPLANT

## 2016-10-02 NOTE — Progress Notes (Signed)
10/02/2016 6:57 PM  Pt had brief episode of bradycardia and hypotension post cardiac cath today.  Came to assess patient.  She improved and rapidly recovered with atropine and dopamine started.  I spoke and met with Dr. Doylene Canard at bedside.  Will hold discharge.  Monitor overnight.  Dr Terrence Dupont will be covering for Dr. Doylene Canard this evening and weekend.  He ordered a CT scan to rule out complication from cath.  See orders.  Critical Care time spent 35 mins  Murvin Natal MD.

## 2016-10-02 NOTE — Consult Note (Signed)
Referring Physician:  Sweta Halseth is an 61 y.o. female.                       Chief Complaint: Chest pain  HPI: 61 year old female with history of hypertension, prediabetes and family history of premature CAD had chest pain, right sided with radiation to right arm, lasting for about 2 hours and improving with NTG paste. Her cardiac enzymes are negative and echocardiogram has normal LV systolic function but nuclear stress test is positive for anterolateral and apical scar and peri-infarct ischemia.  Past Medical History:  Diagnosis Date  . Diabetes mellitus without complication (HCC)    prediabetic  . Diverticular disease   . Hypertension   . Sleep apnea       History reviewed. No pertinent surgical history.  Family History  Problem Relation Age of Onset  . Hypertension Mother   . COPD Mother   . Asthma Mother   . Diabetes Maternal Uncle    Social History:  reports that she has never smoked. She has never used smokeless tobacco. She reports that she does not drink alcohol or use drugs.  Allergies: No Known Allergies  Medications Prior to Admission  Medication Sig Dispense Refill  . hydrochlorothiazide (HYDRODIURIL) 25 MG tablet Take 25 mg by mouth daily.    . Multiple Vitamin (MULTIVITAMIN) tablet Take 1 tablet by mouth.    . olmesartan (BENICAR) 40 MG tablet Take 40 mg by mouth daily.    . diclofenac (VOLTAREN) 75 MG EC tablet TAKE 1 TABLET(75 MG) BY MOUTH TWICE DAILY 180 tablet 2  . meloxicam (MOBIC) 15 MG tablet Take 1 tablet (15 mg total) by mouth daily. 30 tablet 2    Results for orders placed or performed during the hospital encounter of 09/30/16 (from the past 48 hour(s))  Troponin I     Status: None   Collection Time: 09/30/16  1:22 PM  Result Value Ref Range   Troponin I <0.03 <0.03 ng/mL  HIV antibody (Routine Testing)     Status: None   Collection Time: 09/30/16  5:03 PM  Result Value Ref Range   HIV Screen 4th Generation wRfx Non Reactive Non Reactive   Comment: (NOTE) Performed At: [facility] [place], [place] [id number] Lindon Romp MD [id number]   Troponin I-serum (0, 3, 6 hours)     Status: None   Collection Time: 09/30/16  5:03 PM  Result Value Ref Range   Troponin I <0.03 <0.03 ng/mL  Lipid panel     Status: Abnormal   Collection Time: 09/30/16  5:03 PM  Result Value Ref Range   Cholesterol 195 0 - 200 mg/dL   Triglycerides 177 (H) <150 mg/dL   HDL 47 >40 mg/dL   Total CHOL/HDL Ratio 4.1 RATIO   VLDL 35 0 - 40 mg/dL   LDL Cholesterol 113 (H) 0 - 99 mg/dL    Comment:        Total Cholesterol/HDL:CHD Risk Coronary Heart Disease Risk Table                     Men   Women  1/2 Average Risk   3.4   3.3  Average Risk       5.0   4.4  2 X Average Risk   9.6   7.1  3 X Average Risk  23.4   11.0        Use the calculated Patient  Ratio above and the CHD Risk Table to determine the patient's CHD Risk.        ATP III CLASSIFICATION (LDL):  <100     mg/dL   Optimal  100-129  mg/dL   Near or Above                    Optimal  130-159  mg/dL   Borderline  160-189  mg/dL   High  >190     mg/dL   Very High   Hemoglobin A1c     Status: Abnormal   Collection Time: 09/30/16  5:03 PM  Result Value Ref Range   Hgb A1c MFr Bld 6.2 (H) 4.8 - 5.6 %    Comment: (NOTE) Pre diabetes:          5.7%-6.4% Diabetes:              >6.4% Glycemic control for   <7.0% adults with diabetes    Mean Plasma Glucose 131.24 mg/dL  Magnesium     Status: None   Collection Time: 09/30/16  5:03 PM  Result Value Ref Range   Magnesium 2.1 1.7 - 2.4 mg/dL  Troponin I-serum (0, 3, 6 hours)     Status: None   Collection Time: 09/30/16 11:36 PM  Result Value Ref Range   Troponin I <0.03 <0.03 ng/mL  Comprehensive metabolic panel     Status: Abnormal   Collection Time: 10/01/16  2:17 AM  Result Value Ref Range   Sodium 142 135 - 145 mmol/L   Potassium 3.7 3.5 - 5.1 mmol/L   Chloride 108 101 - 111 mmol/L   CO2 27 22 - 32  mmol/L   Glucose, Bld 121 (H) 65 - 99 mg/dL   BUN 11 6 - 20 mg/dL   Creatinine, Ser 0.80 0.44 - 1.00 mg/dL   Calcium 8.7 (L) 8.9 - 10.3 mg/dL   Total Protein 6.5 6.5 - 8.1 g/dL   Albumin 3.2 (L) 3.5 - 5.0 g/dL   AST 21 15 - 41 U/L   ALT 17 14 - 54 U/L   Alkaline Phosphatase 49 38 - 126 U/L   Total Bilirubin 1.1 0.3 - 1.2 mg/dL   GFR calc non Af Amer >60 >60 mL/min   GFR calc Af Amer >60 >60 mL/min    Comment: (NOTE) The eGFR has been calculated using the CKD EPI equation. This calculation has not been validated in all clinical situations. eGFR's persistently <60 mL/min signify possible Chronic Kidney Disease.    Anion gap 7 5 - 15   Nm Myocar Multi W/spect W/wall Motion / Ef  Result Date: 10/02/2016  Blood pressure demonstrated a normal response to exercise.  No T wave inversion was noted during stress.  There was no ST segment deviation noted during stress.  Defect 1: There is a small defect of moderate severity present in the mid anterolateral and apical lateral location.  Findings consistent with prior myocardial infarction with a small amount of peri-infarct ischemia.  This is a low risk study.  The left ventricular ejection fraction is normal (55-65%).  There is a small region of perfusion defect in the apical lateral and mid anterolateral region that is abnormal at rest and slightly worse with stress.  Based on the normal systolic function, suspect that this may be artifact.  However, the TID ratio is also elevated, which can be seen with balanced ischemia.  Consider coronary CT-A or catheterization if clinical suspicion is high.  Review Of Systems Constitutional: No fever, chills, weight loss or gain. Eyes: No vision change, wears glasses. No discharge or pain. Ears: No hearing loss, No tinnitus. Respiratory: No asthma, COPD, pneumonias. positive shortness of breath. No hemoptysis. Cardiovascular: Positive chest pain, palpitation, leg edema. Gastrointestinal: No  nausea, vomiting, diarrhea, constipation. No GI bleed. No hepatitis. Genitourinary: No dysuria, hematuria, kidney stone. No incontinance. Neurological: No headache, stroke, seizures.  Psychiatry: No psych facility admission for anxiety, depression, suicide. No detox. Skin: No rash. Musculoskeletal: No joint pain, fibromyalgia. No neck pain, back pain. Lymphadenopathy: No lymphadenopathy. Hematology: No anemia or easy bruising.   Blood pressure (!) 118/56, pulse (!) 56, temperature 98.2 F (36.8 C), temperature source Oral, resp. rate 20, height '5\' 7"'  (1.702 m), weight 115.8 kg (255 lb 6.4 oz), SpO2 97 %. Body mass index is 40 kg/m. General appearance: alert, cooperative, appears stated age and no distress Head: Normocephalic, atraumatic. Eyes: Brown eyes, pink conjunctiva, corneas clear. PERRL, EOM's intact. Neck: No adenopathy, no carotid bruit, no JVD, supple, symmetrical, trachea midline and thyroid not enlarged. Resp: Clear to auscultation bilaterally. Cardio: Regular rate and rhythm, S1, S2 normal, II/VI systolic murmur, no click, rub or gallop GI: Soft, non-tender; bowel sounds normal; no organomegaly. Extremities: No edema, cyanosis or clubbing. 2 + Palpable DP bilateral feet. Skin: Warm and dry.  Neurologic: Alert and oriented X 3, normal strength. Normal coordination and gait.  Assessment/Plan Unstable angina Hypertension Pre diabetes Obesity Positive FH of premature CAD  Cardiac cath today. Patient understood risk, alternatives and procedure and wants me to proceed.   Birdie Riddle, MD  10/02/2016, 11:28 AM

## 2016-10-02 NOTE — Progress Notes (Signed)
Pt complained of groin pain at the cath site. Site was examined and looked good (level 0). About 10 min after receiving 2mg  of Morpin IV, pt HR dropped in the 30's and she stated that she felt lightheaded.Rapid redponse was paged, and pt received 0.5 mg of Atrpine IV. HR came up to the 60's after receiving atropine and pt felt better. Dr. Sharyn LullHarwani Paged. Colleen Canesar Omran Keelin, RN.

## 2016-10-02 NOTE — Significant Event (Addendum)
Rapid Response Event Note  Overview: Time Called: 1812 Arrival Time: 1815 Event Type: Cardiac  Initial Focused Assessment: Patient status post Cardiac Cath, still on bedrest.  Per patient she complained of discomfort in her right groin.  RN gave 2 mg Morphine. She had received 50 mcg Fentanyl and 2 mg Versed during the procedure. While lying flat in the bed she felt like she was going to "pass out".  Her HR dropped into the 30s.  SB 38  BP 106/87 Lung sounds clear, decreased bases.  Heart tones regular  Interventions: 0.5 amp Atropine give IV HR improved to SR 60s, (her normal rate) She felt much better BP 88/57  SR 61 NS bolus started BP 65/56, check with manual: doppler bp 78 Dopamine gtt started at 135mcg/kg/min BP improved to 131/58  SR 58-62 After patient had been up to BSC  BP 104/55 Then after lying flat for 5-10 min  BP 144/48  Plan of Care (if not transferred): CT of abdomen and pelvis  Event Summary: Name of Physician Notified: Algie CofferKadakia,  at [id number]  Name of Consulting Physician Notified: Dr Sharyn LullHarwani,  Dr Laural BenesJohnson at [phone]  Outcome: Stayed in room and stabalized  Event End Time: 1945  Marcellina MillinLayton, Jeslynn Hollander

## 2016-10-02 NOTE — Progress Notes (Addendum)
Pt BP is low, systolic in the 60-80's, Dopamin started at 5 mcg/ kg/min per Rapid response nurse. Dr. Algie Cofferkadakia arrived at bed side.  Colleen Canesar Dyasia Firestine, RN

## 2016-10-02 NOTE — Progress Notes (Signed)
Ref: Cain Saupe, MD   Subjective:  Had right groin pain, treated with 2 mg. IV morphine resulting in drop in BP and heart rate in 30's. Patient impoves with atropine and dopamine 5 mcg/kg drip with HR in 60's and BP in 130/60's..  Objective:  Vital Signs in the last 24 hours: Temp:  [98.2 F (36.8 C)-98.3 F (36.8 C)] 98.3 F (36.8 C) (08/31 1531) Pulse Rate:  [0-78] 53 (08/31 1419) Cardiac Rhythm: Sinus bradycardia;Other (Comment) (08/31 1810) Resp:  [9-38] 17 (08/31 1531) BP: (118-170)/(56-106) 122/70 (08/31 1531) SpO2:  [97 %-100 %] 98 % (08/31 1531)  Physical Exam: BP Readings from Last 1 Encounters:  10/02/16 122/70    Wt Readings from Last 1 Encounters:  09/30/16 115.8 kg (255 lb 6.4 oz)    Weight change:  Body mass index is 40 kg/m. HEENT: [place]/AT, Eyes-Brown, PERL, EOMI, Conjunctiva-Pink, Sclera-Non-icteric Neck: No JVD, No bruit, Trachea midline. Lungs:  Clear, Bilateral. Cardiac:  Regular rhythm, normal S1 and S2, no S3. II/VI systolic murmur. Abdomen:  Soft, non-tender. BS present. Extremities:  No edema present. No cyanosis. No clubbing. No palpable hematoma.  CNS: AxOx3, Cranial nerves grossly intact, moves all 4 extremities.  Skin: Warm and dry.   Intake/Output from previous day: 08/30 0701 - 08/31 0700 In: 1524 [P.O.:1524] Out: -     Lab Results: BMET    Component Value Date/Time   NA 142 10/01/2016 0217   NA 140 09/30/2016 0730   NA 140 04/28/2015 1720   K 3.7 10/01/2016 0217   K 3.4 (L) 09/30/2016 0730   K 3.2 (L) 04/28/2015 1720   CL 108 10/01/2016 0217   CL 108 09/30/2016 0730   CL 108 04/28/2015 1720   CO2 27 10/01/2016 0217   CO2 27 09/30/2016 0730   CO2 25 04/28/2015 1720   GLUCOSE 121 (H) 10/01/2016 0217   GLUCOSE 132 (H) 09/30/2016 0730   GLUCOSE 102 (H) 04/28/2015 1720   BUN 11 10/01/2016 0217   BUN 13 09/30/2016 0730   BUN 16 04/28/2015 1720   CREATININE 0.80 10/01/2016 0217   CREATININE 0.83 09/30/2016 0730   CREATININE 0.74  04/28/2015 1720   CALCIUM 8.7 (L) 10/01/2016 0217   CALCIUM 8.8 (L) 09/30/2016 0730   CALCIUM 9.0 04/28/2015 1720   GFRNONAA >60 10/01/2016 0217   GFRNONAA >60 09/30/2016 0730   GFRNONAA >60 04/28/2015 1720   GFRAA >60 10/01/2016 0217   GFRAA >60 09/30/2016 0730   GFRAA >60 04/28/2015 1720   CBC    Component Value Date/Time   WBC 6.3 09/30/2016 0730   RBC 4.00 09/30/2016 0730   HGB 12.3 09/30/2016 0730   HCT 37.3 09/30/2016 0730   PLT 268 09/30/2016 0730   MCV 93.3 09/30/2016 0730   MCH 30.8 09/30/2016 0730   MCHC 33.0 09/30/2016 0730   RDW 13.7 09/30/2016 0730   LYMPHSABS 1.6 07/31/2014 1425   MONOABS 0.4 07/31/2014 1425   EOSABS 0.1 07/31/2014 1425   BASOSABS 0.1 07/31/2014 1425   HEPATIC Function Panel  Recent Labs  09/30/16 0730 10/01/16 0217  PROT 6.9 6.5   HEMOGLOBIN A1C No components found for: HGA1C,  MPG CARDIAC ENZYMES Lab Results  Component Value Date   TROPONINI <0.03 09/30/2016   TROPONINI <0.03 09/30/2016   TROPONINI <0.03 09/30/2016   BNP No results for input(s): PROBNP in the last 8760 hours. TSH No results for input(s): TSH in the last 8760 hours. CHOLESTEROL  Recent Labs  09/30/16 1703  CHOL 195  Scheduled Meds: . atropine  0.5 mg Intravenous Once  . enoxaparin (LOVENOX) injection  40 mg Subcutaneous Q24H  . irbesartan  37.5 mg Oral Daily  . regadenoson  0.4 mg Intravenous Once  . sodium chloride flush  3 mL Intravenous Q12H   Continuous Infusions: . sodium chloride    . DOPamine    . DOPamine     PRN Meds:.sodium chloride, acetaminophen, ondansetron (ZOFRAN) IV, sodium chloride flush  Assessment/Plan: Vasovagal reaction Possible morphine adverse effect. R/O retroperitoneal bleed post cardiac cath.  Monitor for another 24 hours.  Will check H/H now and in AM. Continue fluid bolus and dopamine for now.   LOS: 0 days    Orpah CobbAjay Landan Fedie  MD  10/02/2016, 6:59 PM

## 2016-10-02 NOTE — Progress Notes (Signed)
PROGRESS NOTE  Sydney ButteDeborah Huser  UXL:[id number]  DOB: [birth year]  DOA: 09/30/2016 PCP: Cain SaupeFulp, Cammie, MD  Brief Admission Hx: 61 year old female with a history of hypertension, obstructive sleep apnea, prediabetes who presents with chest pain that woke her up from her sleep. Symptoms started at 3 AM this morning when she got up to go to the bathroom.   MDM/Assessment & Plan:     Chest pain Atypical with reproducible symptoms on palpation of the chest wall Cardiac enzymes negative Admit to telemetry 2-D echo normal EF and ruled out wall motion abnormalities Nuclear stress test with perfusion abnormalities: I discussed with Dr. Algie CofferKadakia, he is considering cath later today.   Prediabetes hemoglobin A1c 6.2  Hypokalemia-repleted  Pulmonary nodule -discussed CT findings with the patient, follow-up CT in 6-12 months    Subjective: Pt reports that pain is 0/10 completely resolved.  No further episodes overnight.    Objective: Vitals:   10/01/16 0947 10/01/16 1013 10/01/16 1300 10/01/16 2026  BP: 122/60 (!) 181/75 130/66 (!) 118/56  Pulse:    (!) 56  Resp:   20 20  Temp:   97.9 F (36.6 C) 98.2 F (36.8 C)  TempSrc:   Oral Oral  SpO2:   100% 97%  Weight:      Height:        Intake/Output Summary (Last 24 hours) at 10/02/16 1116 Last data filed at 10/02/16 0900  Gross per 24 hour  Intake             1524 ml  Output                0 ml  Net             1524 ml   Filed Weights   09/30/16 0656 09/30/16 1547  Weight: 113.4 kg (250 lb) 115.8 kg (255 lb 6.4 oz)     REVIEW OF SYSTEMS  As per history otherwise all reviewed and reported negative  Exam:  General exam: awake, alert, NAD, cooperative.   Respiratory system: Clear. No increased work of breathing. Cardiovascular system: S1 & S2 heard, RRR. No JVD, murmurs, gallops, clicks or pedal edema. Gastrointestinal system: Abdomen is nondistended, soft and nontender. Normal bowel sounds heard. Central nervous system:  Alert and oriented. No focal neurological deficits. Extremities: no CCE.  Data Reviewed: Basic Metabolic Panel:  Recent Labs Lab 09/30/16 0730 09/30/16 1703 10/01/16 0217  NA 140  --  142  K 3.4*  --  3.7  CL 108  --  108  CO2 27  --  27  GLUCOSE 132*  --  121*  BUN 13  --  11  CREATININE 0.83  --  0.80  CALCIUM 8.8*  --  8.7*  MG  --  2.1  --    Liver Function Tests:  Recent Labs Lab 09/30/16 0730 10/01/16 0217  AST 21 21  ALT 22 17  ALKPHOS 52 49  BILITOT 0.9 1.1  PROT 6.9 6.5  ALBUMIN 3.5 3.2*    Recent Labs Lab 09/30/16 0730  LIPASE 26   No results for input(s): AMMONIA in the last 168 hours. CBC:  Recent Labs Lab 09/30/16 0730  WBC 6.3  HGB 12.3  HCT 37.3  MCV 93.3  PLT 268   Cardiac Enzymes:  Recent Labs Lab 09/30/16 0730 09/30/16 1322 09/30/16 1703 09/30/16 2336  TROPONINI <0.03 <0.03 <0.03 <0.03   CBG (last 3)  No results for input(s): GLUCAP in the last 72 hours. No results  found for this or any previous visit (from the past 240 hour(s)).   Studies: Nm Myocar Multi W/spect W/wall Motion / Ef  Result Date: 10/02/2016  Blood pressure demonstrated a normal response to exercise.  No T wave inversion was noted during stress.  There was no ST segment deviation noted during stress.  Defect 1: There is a small defect of moderate severity present in the mid anterolateral and apical lateral location.  Findings consistent with prior myocardial infarction with a small amount of peri-infarct ischemia.  This is a low risk study.  The left ventricular ejection fraction is normal (55-65%).  There is a small region of perfusion defect in the apical lateral and mid anterolateral region that is abnormal at rest and slightly worse with stress.  Based on the normal systolic function, suspect that this may be artifact.  However, the TID ratio is also elevated, which can be seen with balanced ischemia.  Consider coronary CT-A or catheterization if  clinical suspicion is high.    Scheduled Meds: . enoxaparin (LOVENOX) injection  40 mg Subcutaneous Q24H  . irbesartan  37.5 mg Oral Daily  . regadenoson  0.4 mg Intravenous Once   Continuous Infusions:  Principal Problem:   Chest pain Active Problems:   Pulmonary nodule  Time spent:   Standley Dakins, MD, FAAFP [facility] Pager [phone]  If 7PM-7AM, please contact night-coverage [email or web address] Password [email or web address] 10/02/2016, 11:16 AM    LOS: 0 days

## 2016-10-02 NOTE — Progress Notes (Addendum)
Site area: RFA Site Prior to Removal:  Level 0 Pressure Applied For:25 min Manual:  yes  Patient Status During Pull:stable   Post Pull Site:  Level 0 Post Pull Instructions Given:  yes Post Pull Pulses Present: palpable Dressing Applied: tegaderm  Bedrest begins @ 1500 till 1900 Comments:   

## 2016-10-02 NOTE — Plan of Care (Signed)
Problem: Education: Goal: Knowledge of [facility] Education information/materials will improve Outcome: Progressing POC reviewed with pt; pt. wanting to be d/c- had stress test 10/01/16; no CP.

## 2016-10-02 NOTE — Discharge Instructions (Signed)
Follow with Primary MD  Cain SaupeFulp, Cammie, MD  and other consultant's as instructed your Hospitalist MD  Please get a complete blood count and chemistry panel checked by your Primary MD at your next visit, and again as instructed by your Primary MD.  Get Medicines reviewed and adjusted: Please take all your medications with you for your next visit with your Primary MD  Laboratory/radiological data: Please request your Primary MD to go over all hospital tests and procedure/radiological results at the follow up, please ask your Primary MD to get all Hospital records sent to his/her office.  In some cases, they will be blood work, cultures and biopsy results pending at the time of your discharge. Please request that your primary care M.D. follows up on these results.  Also Note the following: If you experience worsening of your admission symptoms, develop shortness of breath, life threatening emergency, suicidal or homicidal thoughts you must seek medical attention immediately by calling 911 or calling your MD immediately  if symptoms less severe.  You must read complete instructions/literature along with all the possible adverse reactions/side effects for all the Medicines you take and that have been prescribed to you. Take any new Medicines after you have completely understood and accpet all the possible adverse reactions/side effects.   Do not drive when taking Pain medications or sleeping medications (Benzodaizepines)  Do not take more than prescribed Pain, Sleep and Anxiety Medications. It is not advisable to combine anxiety,sleep and pain medications without talking with your primary care practitioner  Special Instructions: If you have smoked or chewed Tobacco  in the last 2 yrs please stop smoking, stop any regular Alcohol  and or any Recreational drug use.  Wear Seat belts while driving.  Please note: You were cared for by a hospitalist during your hospital stay. Once you are discharged,  your primary care physician will handle any further medical issues. Please note that NO REFILLS for any discharge medications will be authorized once you are discharged, as it is imperative that you return to your primary care physician (or establish a relationship with a primary care physician if you do not have one) for your post hospital discharge needs so that they can reassess your need for medications and monitor your lab values.

## 2016-10-02 NOTE — Progress Notes (Signed)
Dr. Algie CofferKadakia updated on results of CT scan, new orders received for NS at 50/hr. Patient denies any numbness, tingling or paresthesia to right lower extremity. PT/DP +2, RLE cool to touch and appropriate color. RN updated MD that dopamine currently at 94mcg/kg/min and patient unable to wean off medication. RN will continue to monitor.

## 2016-10-02 NOTE — Discharge Summary (Addendum)
Physician Discharge Summary  Sydney Cooper RUE:[id number] DOB: 1955/08/06 DOA: 09/30/2016  PCP: Cain Saupe, MD Cardiologist: Dr. Algie Coffer  Admit date: 09/30/2016 Discharge date: 10/03/2016  Admitted From: HOME  Disposition: HOME  Recommendations for Outpatient Follow-up:  1. Follow up with PCP in 1 weeks 2. Follow up with Dr. Algie Coffer in 1 week 3. Repeat CT chest in 6 months to follow up incidental pulmonary nodule  Discharge Condition: STABLE  CODE STATUS: FULL    Brief Hospitalization Summary: Please see all hospital notes, images, labs for full details of the hospitalization. HPI:  61 year old female with a history of hypertension, obstructive sleep apnea, prediabetes who presents with chest pain that woke her up from her sleep. Symptoms started at 3 AM this morning when she got up to go to the bathroom. She noticed soreness of the right side of the chest with radiation of the pain into her right arm. Pain lasted for about 2 hours. Patient felt short of breath when she got up in the morning and brought himself to the ED. Patient was evaluated at med Center at [facility]. Concerned about positive family history, brother (died of MI 84yo) and sister (myocarditis in 30s). Workup in the ED showed troponin negative 2 D-dimer mildly elevated at 0.68. Obtained CTA of chest rule out PE. CTA was reassuring, no acute findings. She did have incidental finding of pulmonary nodule with recommendation of follow-up CT in 6-12 months.  Given her strong family history, age, and risk factors for heart disease, her HEART score is at least 4 and she has ongoing chest pain; she is admitted for observation  MDM/Assessment & Plan:   Chest pain Atypical with reproducible symptoms on palpation of the chest wall Cardiac enzymes negative Admit to telemetry 2-D echo normal EF and ruled out wall motion abnormalities Nuclear stress test with perfusion abnormalities: I discussed with Dr. Algie Coffer, he performed  cath. He called me with the results to say that the cath showed normal coronary arteries and patient could safely be discharged home after lying flat in bed for 4 hours.  He will see patient in follow up in 1 week.     Post cath patient had episode of bradycardia and hypotension, given atropine and briefly on dopamine infusion. Dr Algie Coffer ordered a CT which showed very small hematoma.  Hg remained stable, patient monitored overnight and remained stable.  Dr. Phylis Bougie says she was stable to discharge home to follow up with Dr. Algie Coffer in 1 week.  Hg repeated and remained stable.    Prediabetes hemoglobin A1c 6.2, carb modified diet and exercise recommended.   Hypokalemia-repleted  Pulmonary nodule -discussed CT findings with the patient, follow-up CT in 6-12 months  Discharge Diagnoses:  Principal Problem:   Chest pain Active Problems:   Abnormal nuclear stress test   Pulmonary nodule  Discharge Instructions: Discharge Instructions    Call MD for:  difficulty breathing, headache or visual disturbances    Complete by:  As directed    Call MD for:  difficulty breathing, headache or visual disturbances    Complete by:  As directed    Call MD for:  extreme fatigue    Complete by:  As directed    Call MD for:  extreme fatigue    Complete by:  As directed    Call MD for:  hives    Complete by:  As directed    Call MD for:  persistant dizziness or light-headedness    Complete by:  As directed  Call MD for:  persistant dizziness or light-headedness    Complete by:  As directed    Call MD for:  persistant nausea and vomiting    Complete by:  As directed    Call MD for:  persistant nausea and vomiting    Complete by:  As directed    Call MD for:  redness, tenderness, or signs of infection (pain, swelling, redness, odor or green/yellow discharge around incision site)    Complete by:  As directed    Call MD for:  redness, tenderness, or signs of infection (pain, swelling, redness,  odor or green/yellow discharge around incision site)    Complete by:  As directed    Call MD for:  severe uncontrolled pain    Complete by:  As directed    Call MD for:  severe uncontrolled pain    Complete by:  As directed    Call MD for:  temperature >100.4    Complete by:  As directed    Call MD for:  temperature >100.4    Complete by:  As directed    Diet - low sodium heart healthy    Complete by:  As directed    Diet - low sodium heart healthy    Complete by:  As directed    Increase activity slowly    Complete by:  As directed    Increase activity slowly    Complete by:  As directed      Allergies as of 10/03/2016   No Known Allergies     Medication List    STOP taking these medications   diclofenac 75 MG EC tablet Commonly known as:  VOLTAREN   meloxicam 15 MG tablet Commonly known as:  MOBIC     TAKE these medications   hydrochlorothiazide 25 MG tablet Commonly known as:  HYDRODIURIL Take 25 mg by mouth daily.   multivitamin tablet Take 1 tablet by mouth.   olmesartan 40 MG tablet Commonly known as:  BENICAR Take 40 mg by mouth daily.            Discharge Care Instructions        Start     Ordered   10/03/16 0000  Increase activity slowly     10/03/16 1023   10/03/16 0000  Diet - low sodium heart healthy     10/03/16 1023   10/03/16 0000  Call MD for:  temperature >100.4     10/03/16 1023   10/03/16 0000  Call MD for:  persistant nausea and vomiting     10/03/16 1023   10/03/16 0000  Call MD for:  severe uncontrolled pain     10/03/16 1023   10/03/16 0000  Call MD for:  redness, tenderness, or signs of infection (pain, swelling, redness, odor or green/yellow discharge around incision site)     10/03/16 1023   10/03/16 0000  Call MD for:  difficulty breathing, headache or visual disturbances     10/03/16 1023   10/03/16 0000  Call MD for:  persistant dizziness or light-headedness     10/03/16 1023   10/03/16 0000  Call MD for:  extreme  fatigue     10/03/16 1023   10/02/16 0000  Increase activity slowly     10/02/16 1549   10/02/16 0000  Diet - low sodium heart healthy     10/02/16 1549   10/02/16 0000  Call MD for:  temperature >100.4     10/02/16 1549  10/02/16 0000  Call MD for:  persistant nausea and vomiting     10/02/16 1549   10/02/16 0000  Call MD for:  severe uncontrolled pain     10/02/16 1549   10/02/16 0000  Call MD for:  difficulty breathing, headache or visual disturbances     10/02/16 1549   10/02/16 0000  Call MD for:  redness, tenderness, or signs of infection (pain, swelling, redness, odor or green/yellow discharge around incision site)     10/02/16 1549   10/02/16 0000  Call MD for:  hives     10/02/16 1549   10/02/16 0000  Call MD for:  persistant dizziness or light-headedness     10/02/16 1549   10/02/16 0000  Call MD for:  extreme fatigue     10/02/16 1549     Follow-up Information    Orpah Cobb, MD. Schedule an appointment as soon as possible for a visit in 1 week(s).   Specialty:  Cardiology Contact information: [address] [place] [address] [phone]        Cain Saupe, MD. Schedule an appointment as soon as possible for a visit in 2 week(s).   Specialty:  Family Medicine Why:  Hospital Follow Up  Contact information: [address] [place] [address] [phone]          No Known Allergies Current Discharge Medication List    CONTINUE these medications which have NOT CHANGED   Details  hydrochlorothiazide (HYDRODIURIL) 25 MG tablet Take 25 mg by mouth daily.    Multiple Vitamin (MULTIVITAMIN) tablet Take 1 tablet by mouth.    olmesartan (BENICAR) 40 MG tablet Take 40 mg by mouth daily.      STOP taking these medications     diclofenac (VOLTAREN) 75 MG EC tablet      meloxicam (MOBIC) 15 MG tablet         Procedures/Studies: Ct Abdomen Pelvis Wo Contrast  Result Date: 10/02/2016 CLINICAL DATA:  62 year old female with  decreasing hemoglobin following cardiac catheterization. Evaluate for retroperitoneal or abdominal/ pelvic hematoma. EXAM: CT ABDOMEN AND PELVIS WITHOUT CONTRAST TECHNIQUE: Multidetector CT imaging of the abdomen and pelvis was performed following the standard protocol without IV contrast. COMPARISON:  04/28/2015 and prior CTs FINDINGS: Please note that parenchymal abnormalities may be missed without intravenous contrast. Lower chest: Mild bibasilar atelectasis noted. Hepatobiliary: The liver and gallbladder are unremarkable. No biliary dilatation. Pancreas: Unremarkable Spleen: Unremarkable Adrenals/Urinary Tract: The kidneys, adrenal glands and bladder are unremarkable. Contrast within the urinary collecting system is noted from recent catheterization. Stomach/Bowel: Stomach is within normal limits. No evidence of bowel wall thickening, distention, or inflammatory changes. Colonic diverticulosis noted without evidence of diverticulitis. Vascular/Lymphatic: Ill-defined hemorrhage/hematoma along the right pelvic sidewall measures 1.8 x 7.5 x 10 cm (transverse x AP x CC). No other vascular abnormalities noted. No enlarged lymph nodes identified. Reproductive: Uterus and bilateral adnexa are unremarkable. Other: No ascites or pneumoperitoneum Musculoskeletal: No acute or significant osseous findings. IMPRESSION: 1. Small to moderate ill-defined hemorrhage/hematoma along the right pelvic sidewall measuring 1.8 x 7.5 x 10 cm. 2. Mild bibasilar atelectasis. 3. These results will be called to the ordering clinician or representative by the Radiologist Assistant, and communication documented in the PACS or zVision Dashboard. Electronically Signed   By: Harmon Pier M.D.   On: 10/02/2016 20:52   Dg Chest 2 View  Result Date: 09/30/2016 CLINICAL DATA:  Lambert Mody right-sided chest pain. EXAM: CHEST  2 VIEW COMPARISON:  None.  FINDINGS: The cardiomediastinal silhouette is mildly enlarged. Normal pulmonary vascularity. No focal  consolidation, pleural effusion, or pneumothorax. No acute osseous abnormality. IMPRESSION: Mild cardiomegaly.  No active cardiopulmonary disease. Electronically Signed   By: Obie DredgeWilliam T Derry M.D.   On: 09/30/2016 08:33   Ct Angio Chest Pe W/cm &/or Wo Cm  Result Date: 09/30/2016 CLINICAL DATA:  Chest pain, shortness of breath. EXAM: CT ANGIOGRAPHY CHEST WITH CONTRAST TECHNIQUE: Multidetector CT imaging of the chest was performed using the standard protocol during bolus administration of intravenous contrast. Multiplanar CT image reconstructions and MIPs were obtained to evaluate the vascular anatomy. CONTRAST:  100 mL of Isovue 370 intravenously. COMPARISON:  Radiographs of same day. FINDINGS: Cardiovascular: Satisfactory opacification of the pulmonary arteries to the segmental level. No evidence of pulmonary embolism. Mild cardiomegaly is noted. No pericardial effusion. Mediastinum/Nodes: No enlarged mediastinal, hilar, or axillary lymph nodes. Thyroid gland, trachea, and esophagus demonstrate no significant findings. Lungs/Pleura: 8 mm nodular density is seen in right upper lobe best seen on image number 43 of series 7. No pneumothorax or pleural effusion is noted. Upper Abdomen: No acute abnormality. Musculoskeletal: No chest wall abnormality. No acute or significant osseous findings. Review of the MIP images confirms the above findings. IMPRESSION: No definite evidence of pulmonary embolus. Mild cardiomegaly. 8 mm right upper lobe nodule. Non-contrast chest CT at 6-12 months is recommended. If the nodule is stable at time of repeat CT, then future CT at 18-24 months (from today's scan) is considered optional for low-risk patients, but is recommended for high-risk patients. This recommendation follows the consensus statement: Guidelines for Management of Incidental Pulmonary Nodules Detected on CT Images: From the Fleischner Society 2017; Radiology 2017; 284:228-243. Electronically Signed   By: Lupita RaiderJames  Green  Jr, M.D.   On: 09/30/2016 09:32   Nm Myocar Multi W/spect W/wall Motion / Ef  Result Date: 10/02/2016  Blood pressure demonstrated a normal response to exercise.  No T wave inversion was noted during stress.  There was no ST segment deviation noted during stress.  Defect 1: There is a small defect of moderate severity present in the mid anterolateral and apical lateral location.  Findings consistent with prior myocardial infarction with a small amount of peri-infarct ischemia.  This is a low risk study.  The left ventricular ejection fraction is normal (55-65%).  There is a small region of perfusion defect in the apical lateral and mid anterolateral region that is abnormal at rest and slightly worse with stress.  Based on the normal systolic function, suspect that this may be artifact.  However, the TID ratio is also elevated, which can be seen with balanced ischemia.  Consider coronary CT-A or catheterization if clinical suspicion is high.      Subjective: Pt says she has no chest pain or SOB. No symptoms.  No other complaints.  Ready to discharge home.   Discharge Exam: Vitals:   10/03/16 0505 10/03/16 0515  BP: (!) 110/53   Pulse: (!) 47 (!) 56  Resp: 18 (!) 21  Temp: 97.9 F (36.6 C)   SpO2: 100% 100%   Vitals:   10/03/16 0315 10/03/16 0435 10/03/16 0505 10/03/16 0515  BP:  (!) 110/53 (!) 110/53   Pulse: (!) 55 (!) 44 (!) 47 (!) 56  Resp: 18 15 18  (!) 21  Temp:   97.9 F (36.6 C)   TempSrc:   Oral   SpO2: 98% 100% 100% 100%  Weight:   116.3 kg (256 lb 4.8 oz)  Height:       General: Pt is alert, awake, not in acute distress Cardiovascular: RRR, S1/S2 +, no rubs, no gallops Respiratory: CTA bilaterally, no wheezing, no rhonchi Abdominal: Soft, NT, ND, bowel sounds + Extremities: no edema, no cyanosis   The results of significant diagnostics from this hospitalization (including imaging, microbiology, ancillary and laboratory) are listed below for reference.      Microbiology: No results found for this or any previous visit (from the past 240 hour(s)).   Labs: BNP (last 3 results) No results for input(s): BNP in the last 8760 hours. Basic Metabolic Panel:  Recent Labs Lab 09/30/16 0730 09/30/16 1703 10/01/16 0217 10/03/16 0313  NA 140  --  142 142  K 3.4*  --  3.7 3.6  CL 108  --  108 111  CO2 27  --  27 23  GLUCOSE 132*  --  121* 118*  BUN 13  --  11 11  CREATININE 0.83  --  0.80 0.70  CALCIUM 8.8*  --  8.7* 8.5*  MG  --  2.1  --   --    Liver Function Tests:  Recent Labs Lab 09/30/16 0730 10/01/16 0217  AST 21 21  ALT 22 17  ALKPHOS 52 49  BILITOT 0.9 1.1  PROT 6.9 6.5  ALBUMIN 3.5 3.2*    Recent Labs Lab 09/30/16 0730  LIPASE 26   No results for input(s): AMMONIA in the last 168 hours. CBC:  Recent Labs Lab 09/30/16 0730 10/02/16 1910 10/03/16 0313  WBC 6.3 8.4 8.0  HGB 12.3 12.5 12.2  HCT 37.3 39.5 36.9  MCV 93.3 94.5 92.3  PLT 268 250 229   Cardiac Enzymes:  Recent Labs Lab 09/30/16 0730 09/30/16 1322 09/30/16 1703 09/30/16 2336  TROPONINI <0.03 <0.03 <0.03 <0.03   BNP: Invalid input(s): POCBNP CBG: No results for input(s): GLUCAP in the last 168 hours. D-Dimer No results for input(s): DDIMER in the last 72 hours. Hgb A1c  Recent Labs  09/30/16 1703  HGBA1C 6.2*   Lipid Profile  Recent Labs  09/30/16 1703  CHOL 195  HDL 47  LDLCALC 113*  TRIG 177*  CHOLHDL 4.1   Thyroid function studies No results for input(s): TSH, T4TOTAL, T3FREE, THYROIDAB in the last 72 hours.  Invalid input(s): FREET3 Anemia work up No results for input(s): VITAMINB12, FOLATE, FERRITIN, TIBC, IRON, RETICCTPCT in the last 72 hours. Urinalysis    Component Value Date/Time   COLORURINE YELLOW 04/28/2015 1615   APPEARANCEUR CLEAR 04/28/2015 1615   LABSPEC 1.021 04/28/2015 1615   PHURINE 6.5 04/28/2015 1615   GLUCOSEU NEGATIVE 04/28/2015 1615   HGBUR NEGATIVE 04/28/2015 1615   BILIRUBINUR  NEGATIVE 04/28/2015 1615   KETONESUR NEGATIVE 04/28/2015 1615   PROTEINUR NEGATIVE 04/28/2015 1615   UROBILINOGEN 1.0 07/31/2014 1515   NITRITE NEGATIVE 04/28/2015 1615   LEUKOCYTESUR MODERATE (A) 04/28/2015 1615   Sepsis Labs Invalid input(s): PROCALCITONIN,  WBC,  LACTICIDVEN Microbiology No results found for this or any previous visit (from the past 240 hour(s)).  Time coordinating discharge:  31 minutes  SIGNED:  Standley Dakins, MD  [facility] 10/03/2016, 10:27 AM Pager [phone]  If 7PM-7AM, please contact night-coverage [email or web address] Password [email or web address]

## 2016-10-03 LAB — BASIC METABOLIC PANEL
Anion gap: 8 (ref 5–15)
BUN: 11 mg/dL (ref 6–20)
CHLORIDE: 111 mmol/L (ref 101–111)
CO2: 23 mmol/L (ref 22–32)
Calcium: 8.5 mg/dL — ABNORMAL LOW (ref 8.9–10.3)
Creatinine, Ser: 0.7 mg/dL (ref 0.44–1.00)
GFR calc Af Amer: >60 mL/min (ref >60)
GFR calc non Af Amer: >60 mL/min (ref >60)
GLUCOSE: 118 mg/dL — AB (ref 65–99)
POTASSIUM: 3.6 mmol/L (ref 3.5–5.1)
Sodium: 142 mmol/L (ref 135–145)

## 2016-10-03 LAB — CBC
HEMATOCRIT: 36.9 % (ref 36.0–46.0)
Hemoglobin: 12.2 g/dL (ref 12.0–15.0)
MCH: 30.5 pg (ref 26.0–34.0)
MCHC: 33.1 g/dL (ref 30.0–36.0)
MCV: 92.3 fL (ref 78.0–100.0)
PLATELETS: 229 10*3/uL (ref 150–400)
RBC: 4 MIL/uL (ref 3.87–5.11)
RDW: 13.8 % (ref 11.5–15.5)
WBC: 8 10*3/uL (ref 4.0–10.5)

## 2016-10-03 NOTE — Progress Notes (Signed)
Subjective:  Patient denies any chest pain or shortness of breath. Denies any dizziness. Up in room walking. Had episode of vasovagal hypotension and bradycardia yesterday. Head CT of the groin which showed very small hematoma no evidence of retroperitoneal bleed.  Objective:  Vital Signs in the last 24 hours: Temp:  [97.9 F (36.6 C)-98.3 F (36.8 C)] 97.9 F (36.6 C) (09/01 0505) Pulse Rate:  [0-78] 56 (09/01 0515) Resp:  [9-38] 21 (09/01 0515) BP: (65-170)/(35-106) 110/53 (09/01 0505) SpO2:  [97 %-100 %] 100 % (09/01 0515) Weight:  [116.3 kg (256 lb 4.8 oz)] 116.3 kg (256 lb 4.8 oz) (09/01 0505)  Intake/Output from previous day: 08/31 0701 - 09/01 0700 In: 631.6 [P.O.:600; I.V.:31.6] Out: 2450 [Urine:2450] Intake/Output from this shift: No intake/output data recorded.  Physical Exam: Neck: no adenopathy, no carotid bruit, no JVD and supple, symmetrical, trachea midline Lungs: clear to auscultation bilaterally Heart: regular rate and rhythm, S1, S2 normal, no murmur, click, rub or gallop Abdomen: soft, non-tender; bowel sounds normal; no masses,  no organomegaly Extremities: extremities normal, atraumatic, no cyanosis or edema and Right groin soft no hematoma or bruit  Lab Results:  Recent Labs  10/02/16 1910 10/03/16 0313  WBC 8.4 8.0  HGB 12.5 12.2  PLT 250 229    Recent Labs  10/01/16 0217 10/03/16 0313  NA 142 142  K 3.7 3.6  CL 108 111  CO2 27 23  GLUCOSE 121* 118*  BUN 11 11  CREATININE 0.80 0.70    Recent Labs  09/30/16 1703 09/30/16 2336  TROPONINI <0.03 <0.03   Hepatic Function Panel  Recent Labs  10/01/16 0217  PROT 6.5  ALBUMIN 3.2*  AST 21  ALT 17  ALKPHOS 49  BILITOT 1.1    Recent Labs  09/30/16 1703  CHOL 195   No results for input(s): PROTIME in the last 72 hours.  Imaging: Imaging results have been reviewed and Ct Abdomen Pelvis Wo Contrast  Result Date: 10/02/2016 CLINICAL DATA:  61 year old female with decreasing  hemoglobin following cardiac catheterization. Evaluate for retroperitoneal or abdominal/ pelvic hematoma. EXAM: CT ABDOMEN AND PELVIS WITHOUT CONTRAST TECHNIQUE: Multidetector CT imaging of the abdomen and pelvis was performed following the standard protocol without IV contrast. COMPARISON:  04/28/2015 and prior CTs FINDINGS: Please note that parenchymal abnormalities may be missed without intravenous contrast. Lower chest: Mild bibasilar atelectasis noted. Hepatobiliary: The liver and gallbladder are unremarkable. No biliary dilatation. Pancreas: Unremarkable Spleen: Unremarkable Adrenals/Urinary Tract: The kidneys, adrenal glands and bladder are unremarkable. Contrast within the urinary collecting system is noted from recent catheterization. Stomach/Bowel: Stomach is within normal limits. No evidence of bowel wall thickening, distention, or inflammatory changes. Colonic diverticulosis noted without evidence of diverticulitis. Vascular/Lymphatic: Ill-defined hemorrhage/hematoma along the right pelvic sidewall measures 1.8 x 7.5 x 10 cm (transverse x AP x CC). No other vascular abnormalities noted. No enlarged lymph nodes identified. Reproductive: Uterus and bilateral adnexa are unremarkable. Other: No ascites or pneumoperitoneum Musculoskeletal: No acute or significant osseous findings. IMPRESSION: 1. Small to moderate ill-defined hemorrhage/hematoma along the right pelvic sidewall measuring 1.8 x 7.5 x 10 cm. 2. Mild bibasilar atelectasis. 3. These results will be called to the ordering clinician or representative by the Radiologist Assistant, and communication documented in the PACS or zVision Dashboard. Electronically Signed   By: Harmon Pier M.D.   On: 10/02/2016 20:52   Nm Myocar Multi W/spect W/wall Motion / Ef  Result Date: 10/02/2016  Blood pressure demonstrated a normal response to exercise.  No T wave inversion was noted during stress.  There was no ST segment deviation noted during stress.   Defect 1: There is a small defect of moderate severity present in the mid anterolateral and apical lateral location.  Findings consistent with prior myocardial infarction with a small amount of peri-infarct ischemia.  This is a low risk study.  The left ventricular ejection fraction is normal (55-65%).  There is a small region of perfusion defect in the apical lateral and mid anterolateral region that is abnormal at rest and slightly worse with stress.  Based on the normal systolic function, suspect that this may be artifact.  However, the TID ratio is also elevated, which can be seen with balanced ischemia.  Consider coronary CT-A or catheterization if clinical suspicion is high.     Cardiac Studies:  Assessment/Plan:  Nonobstructive CAD Hypertension Prediabetic Obesity Status post vasovagal episode Very small hematoma right groin by CT stable Positive family history of premature coronary artery disease Plan Okay to discharge from cardiac point of view. Follow with  Dr. Algie CofferKadakia in one week Post cardiac cath instructions have been given  LOS: 1 day    Rinaldo CloudHarwani, Silvina Hackleman 10/03/2016, 8:52 AM

## 2016-10-06 ENCOUNTER — Encounter (HOSPITAL_COMMUNITY): Payer: Self-pay | Admitting: Cardiovascular Disease

## 2016-10-13 DIAGNOSIS — R079 Chest pain, unspecified: Secondary | ICD-10-CM | POA: Diagnosis not present

## 2016-10-13 DIAGNOSIS — K219 Gastro-esophageal reflux disease without esophagitis: Secondary | ICD-10-CM | POA: Diagnosis not present

## 2016-10-13 DIAGNOSIS — Z23 Encounter for immunization: Secondary | ICD-10-CM | POA: Diagnosis not present

## 2016-10-13 DIAGNOSIS — R911 Solitary pulmonary nodule: Secondary | ICD-10-CM | POA: Diagnosis not present

## 2016-10-14 DIAGNOSIS — I10 Essential (primary) hypertension: Secondary | ICD-10-CM | POA: Diagnosis not present

## 2016-10-14 DIAGNOSIS — R072 Precordial pain: Secondary | ICD-10-CM | POA: Diagnosis not present

## 2016-10-14 DIAGNOSIS — Z8249 Family history of ischemic heart disease and other diseases of the circulatory system: Secondary | ICD-10-CM | POA: Diagnosis not present

## 2016-10-14 DIAGNOSIS — E6609 Other obesity due to excess calories: Secondary | ICD-10-CM | POA: Diagnosis not present

## 2016-12-04 DIAGNOSIS — M1712 Unilateral primary osteoarthritis, left knee: Secondary | ICD-10-CM | POA: Diagnosis not present

## 2016-12-10 DIAGNOSIS — K080 Exfoliation of teeth due to systemic causes: Secondary | ICD-10-CM | POA: Diagnosis not present

## 2016-12-21 DIAGNOSIS — G4733 Obstructive sleep apnea (adult) (pediatric): Secondary | ICD-10-CM | POA: Diagnosis not present

## 2017-02-26 DIAGNOSIS — R7303 Prediabetes: Secondary | ICD-10-CM | POA: Diagnosis not present

## 2017-02-26 DIAGNOSIS — I10 Essential (primary) hypertension: Secondary | ICD-10-CM | POA: Diagnosis not present

## 2017-04-08 ENCOUNTER — Telehealth: Payer: Self-pay | Admitting: *Deleted

## 2017-04-08 NOTE — Telephone Encounter (Signed)
Pt asked if she had to remove her nail polish to have her ingrown toenail procedure. I told pt that I would be best.

## 2017-04-15 ENCOUNTER — Ambulatory Visit: Payer: Federal, State, Local not specified - PPO | Admitting: Podiatry

## 2017-05-07 DIAGNOSIS — H10023 Other mucopurulent conjunctivitis, bilateral: Secondary | ICD-10-CM | POA: Diagnosis not present

## 2017-05-11 DIAGNOSIS — H1131 Conjunctival hemorrhage, right eye: Secondary | ICD-10-CM | POA: Diagnosis not present

## 2017-05-11 DIAGNOSIS — M25562 Pain in left knee: Secondary | ICD-10-CM | POA: Diagnosis not present

## 2017-05-11 DIAGNOSIS — J069 Acute upper respiratory infection, unspecified: Secondary | ICD-10-CM | POA: Diagnosis not present

## 2017-05-11 DIAGNOSIS — M25561 Pain in right knee: Secondary | ICD-10-CM | POA: Diagnosis not present

## 2017-06-01 DIAGNOSIS — Z1231 Encounter for screening mammogram for malignant neoplasm of breast: Secondary | ICD-10-CM | POA: Diagnosis not present

## 2017-06-01 DIAGNOSIS — R87610 Atypical squamous cells of undetermined significance on cytologic smear of cervix (ASC-US): Secondary | ICD-10-CM | POA: Diagnosis not present

## 2017-06-01 DIAGNOSIS — Z01419 Encounter for gynecological examination (general) (routine) without abnormal findings: Secondary | ICD-10-CM | POA: Diagnosis not present

## 2017-06-01 DIAGNOSIS — Z6838 Body mass index (BMI) 38.0-38.9, adult: Secondary | ICD-10-CM | POA: Diagnosis not present

## 2017-06-01 DIAGNOSIS — Z124 Encounter for screening for malignant neoplasm of cervix: Secondary | ICD-10-CM | POA: Diagnosis not present

## 2017-06-23 DIAGNOSIS — K080 Exfoliation of teeth due to systemic causes: Secondary | ICD-10-CM | POA: Diagnosis not present

## 2017-07-15 DIAGNOSIS — M170 Bilateral primary osteoarthritis of knee: Secondary | ICD-10-CM | POA: Diagnosis not present

## 2017-08-09 DIAGNOSIS — M170 Bilateral primary osteoarthritis of knee: Secondary | ICD-10-CM | POA: Diagnosis not present

## 2017-08-16 DIAGNOSIS — M170 Bilateral primary osteoarthritis of knee: Secondary | ICD-10-CM | POA: Diagnosis not present

## 2017-08-23 DIAGNOSIS — M170 Bilateral primary osteoarthritis of knee: Secondary | ICD-10-CM | POA: Diagnosis not present

## 2017-08-24 DIAGNOSIS — L509 Urticaria, unspecified: Secondary | ICD-10-CM | POA: Diagnosis not present

## 2018-02-01 DIAGNOSIS — I10 Essential (primary) hypertension: Secondary | ICD-10-CM | POA: Diagnosis not present

## 2018-02-01 DIAGNOSIS — R7303 Prediabetes: Secondary | ICD-10-CM | POA: Diagnosis not present

## 2018-02-01 DIAGNOSIS — Z23 Encounter for immunization: Secondary | ICD-10-CM | POA: Diagnosis not present

## 2018-02-07 DIAGNOSIS — K080 Exfoliation of teeth due to systemic causes: Secondary | ICD-10-CM | POA: Diagnosis not present

## 2018-03-09 DIAGNOSIS — G4733 Obstructive sleep apnea (adult) (pediatric): Secondary | ICD-10-CM | POA: Diagnosis not present

## 2018-04-11 DIAGNOSIS — M1712 Unilateral primary osteoarthritis, left knee: Secondary | ICD-10-CM | POA: Diagnosis not present

## 2018-05-09 DIAGNOSIS — M1712 Unilateral primary osteoarthritis, left knee: Secondary | ICD-10-CM | POA: Diagnosis not present

## 2018-05-16 DIAGNOSIS — M1712 Unilateral primary osteoarthritis, left knee: Secondary | ICD-10-CM | POA: Diagnosis not present

## 2018-05-23 DIAGNOSIS — M1712 Unilateral primary osteoarthritis, left knee: Secondary | ICD-10-CM | POA: Diagnosis not present

## 2018-09-21 DIAGNOSIS — K080 Exfoliation of teeth due to systemic causes: Secondary | ICD-10-CM | POA: Diagnosis not present

## 2018-11-08 DIAGNOSIS — R7303 Prediabetes: Secondary | ICD-10-CM | POA: Diagnosis not present

## 2018-11-08 DIAGNOSIS — I10 Essential (primary) hypertension: Secondary | ICD-10-CM | POA: Diagnosis not present

## 2018-11-08 DIAGNOSIS — M545 Low back pain: Secondary | ICD-10-CM | POA: Diagnosis not present

## 2019-01-09 ENCOUNTER — Other Ambulatory Visit: Payer: Self-pay | Admitting: Podiatry

## 2019-01-09 ENCOUNTER — Ambulatory Visit (INDEPENDENT_AMBULATORY_CARE_PROVIDER_SITE_OTHER): Payer: Federal, State, Local not specified - PPO

## 2019-01-09 ENCOUNTER — Other Ambulatory Visit: Payer: Self-pay

## 2019-01-09 ENCOUNTER — Ambulatory Visit: Payer: Federal, State, Local not specified - PPO | Admitting: Podiatry

## 2019-01-09 ENCOUNTER — Encounter: Payer: Self-pay | Admitting: Podiatry

## 2019-01-09 DIAGNOSIS — M76821 Posterior tibial tendinitis, right leg: Secondary | ICD-10-CM

## 2019-01-09 DIAGNOSIS — M779 Enthesopathy, unspecified: Secondary | ICD-10-CM

## 2019-01-09 DIAGNOSIS — M79671 Pain in right foot: Secondary | ICD-10-CM | POA: Diagnosis not present

## 2019-01-09 DIAGNOSIS — E669 Obesity, unspecified: Secondary | ICD-10-CM | POA: Insufficient documentation

## 2019-01-09 DIAGNOSIS — I10 Essential (primary) hypertension: Secondary | ICD-10-CM | POA: Insufficient documentation

## 2019-01-09 MED ORDER — DICLOFENAC SODIUM 75 MG PO TBEC: 75.0000 mg | DELAYED_RELEASE_TABLET | Freq: Two times a day (BID) | ORAL | 2 refills | Status: DC

## 2019-01-09 NOTE — Progress Notes (Signed)
Subjective:   Patient ID: Sydney Cooper, female   DOB: 63 y.o.   MRN: [id number]   HPI Patient presents with pain in the medial side of the right foot and admits that she has been walking quite a bit and that she was not wearing her orthotics or shoes with good support.  Patient does not smoke likes to be active   Review of Systems  All other systems reviewed and are negative.       Objective:  Physical Exam Vitals signs and nursing note reviewed.  Constitutional:      Appearance: She is well-developed.  Pulmonary:     Effort: Pulmonary effort is normal.  Musculoskeletal: Normal range of motion.  Skin:    General: Skin is warm.  Neurological:     Mental Status: She is alert.     Neurovascular status intact muscle strength adequate range of motion within normal limits with inflammation pain of the posterior tibial tendon as it comes underneath the medial malleolus and inserts into the navicular.  There is quite a bit of inflammation around the area and it is painful when pressed with patient having depressed arches and good digital perfusion     Assessment:  Posterior tibial tendinitis right with inflammation tendon near its insertion navicular with foot structural issues     Plan:  H&P conditions reviewed and today I did careful sheath injection 3 mg Kenalog 5 mg Xylocaine and applied fascial brace to lift up the arch and discussed possible orthotics long-term and she will bring her old orthotics with her.  Placed on diclofenac 75 mg twice daily and reappoint to recheck  X-rays indicate there is depression of the arch with no indications of stress fracture or arthritis

## 2019-01-21 DIAGNOSIS — Z03818 Encounter for observation for suspected exposure to other biological agents ruled out: Secondary | ICD-10-CM | POA: Diagnosis not present

## 2019-01-23 ENCOUNTER — Ambulatory Visit: Payer: Federal, State, Local not specified - PPO | Admitting: Podiatry

## 2019-01-23 ENCOUNTER — Encounter: Payer: Self-pay | Admitting: Podiatry

## 2019-01-23 ENCOUNTER — Other Ambulatory Visit: Payer: Self-pay

## 2019-01-23 DIAGNOSIS — M76822 Posterior tibial tendinitis, left leg: Secondary | ICD-10-CM | POA: Diagnosis not present

## 2019-01-23 DIAGNOSIS — M76821 Posterior tibial tendinitis, right leg: Secondary | ICD-10-CM | POA: Diagnosis not present

## 2019-01-24 NOTE — Progress Notes (Signed)
Subjective:   Patient ID: Sydney Cooper, female   DOB: 63 y.o.   MRN: [id number]   HPI Patient states improved but still having a reasonable amount of pain in the right ankle stating that it aches mostly when barefoot or wearing flat shoes   ROS      Objective:  Physical Exam  Neurovascular status intact with inflammation pain of the right posterior tibial tendon that is improved with no indication currently of tendon dysfunction with moderate flatfoot deformity and old orthotics that have lost some of their ability to hold up the arch     Assessment:  Posterior tibial tendinitis right with moderate improvement but area of continued pain     Plan:  H&P reviewed condition and went ahead today and advised this patient on long-term orthotics and patient is scanned for customized orthotic devices.  Patient will be seen back when ready and will be dispensed by ped orthotist and will see me depending on symptoms in the arch

## 2019-04-03 ENCOUNTER — Ambulatory Visit: Payer: Federal, State, Local not specified - PPO | Attending: Internal Medicine

## 2019-04-03 DIAGNOSIS — Z23 Encounter for immunization: Secondary | ICD-10-CM | POA: Insufficient documentation

## 2019-04-03 NOTE — Progress Notes (Signed)
   Covid-19 Vaccination Clinic  Name:  Sydney Cooper    MRN: [id number] DOB: December 05, 1955  04/03/2019  Sydney Cooper was observed post Covid-19 immunization for 15 minutes without incidence. She was provided with Vaccine Information Sheet and instruction to access the V-Safe system.   Sydney Cooper was instructed to call 911 with any severe reactions post vaccine: Marland Kitchen Difficulty breathing  . Swelling of your face and throat  . A fast heartbeat  . A bad rash all over your body  . Dizziness and weakness    Immunizations Administered    Name Date Dose VIS Date Route   Pfizer COVID-19 Vaccine 04/03/2019 10:45 AM 0.3 mL 01/13/2019 Intramuscular   Manufacturer: [employer], [employer]   Lot: [id number]   NDC: [address]-3

## 2019-04-26 ENCOUNTER — Ambulatory Visit: Payer: Federal, State, Local not specified - PPO | Attending: Internal Medicine

## 2019-04-26 DIAGNOSIS — Z23 Encounter for immunization: Secondary | ICD-10-CM

## 2019-04-26 NOTE — Progress Notes (Signed)
   Covid-19 Vaccination Clinic  Name:  Sydney Cooper    MRN: [id number] DOB: Jun 10, 1955  04/26/2019  [name] was observed post Covid-19 immunization for 15 minutes without incident. She was provided with Vaccine Information Sheet and instruction to access the V-Safe system.   [name] was instructed to call 911 with any severe reactions post vaccine: Marland Kitchen Difficulty breathing  . Swelling of face and throat  . A fast heartbeat  . A bad rash all over body  . Dizziness and weakness   Immunizations Administered    Name Date Dose VIS Date Route   Pfizer COVID-19 Vaccine 04/26/2019 10:01 AM 0.3 mL 01/13/2019 Intramuscular   Manufacturer: [employer], [employer]   Lot: [id number]   NDC: [address]-1

## 2019-05-13 IMAGING — DX DG CHEST 2V
2 series · 2 of 2 positions shown · non-contrast
Comparison: None.

CLINICAL DATA: Sharp right-sided chest pain.

EXAM:
CHEST  2 VIEW

[chest pa]
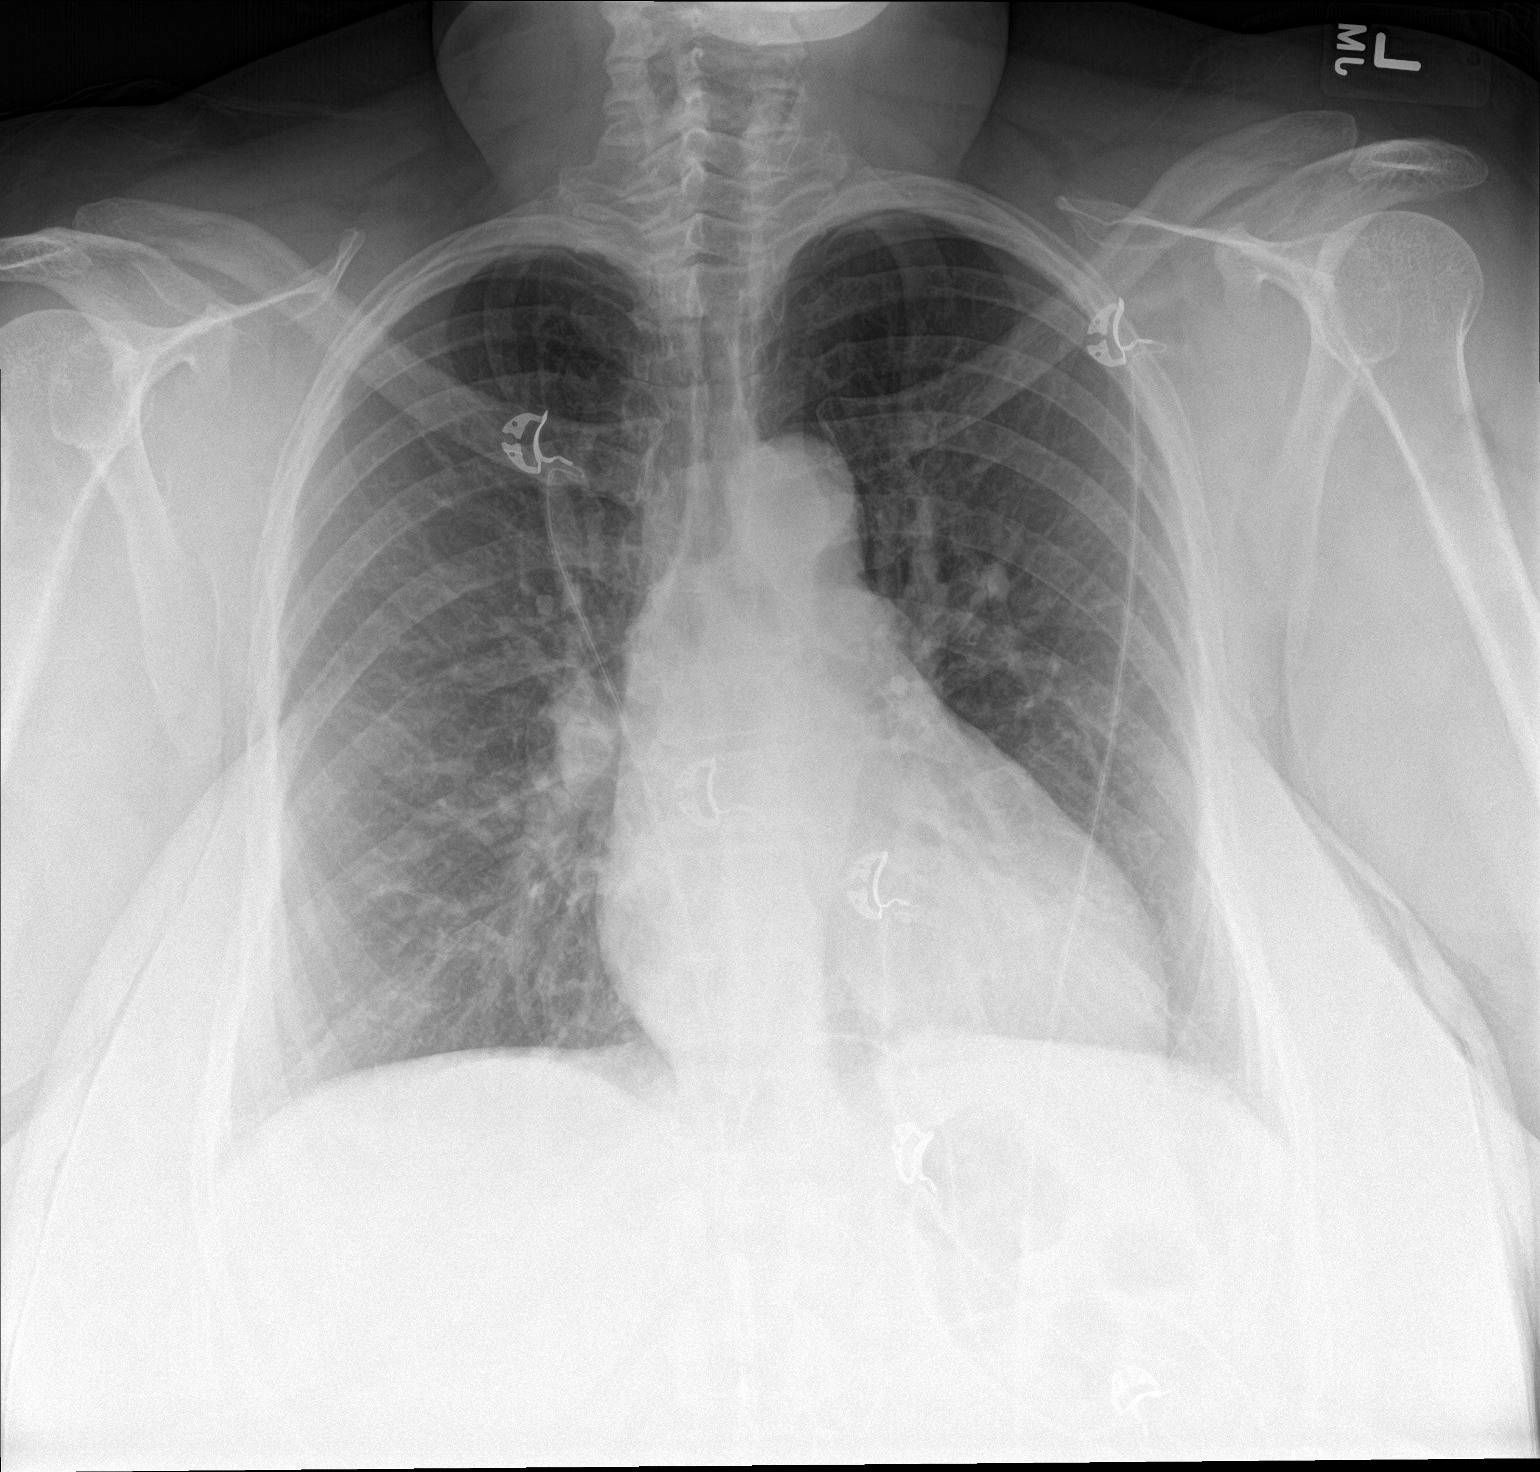

[chest lat]
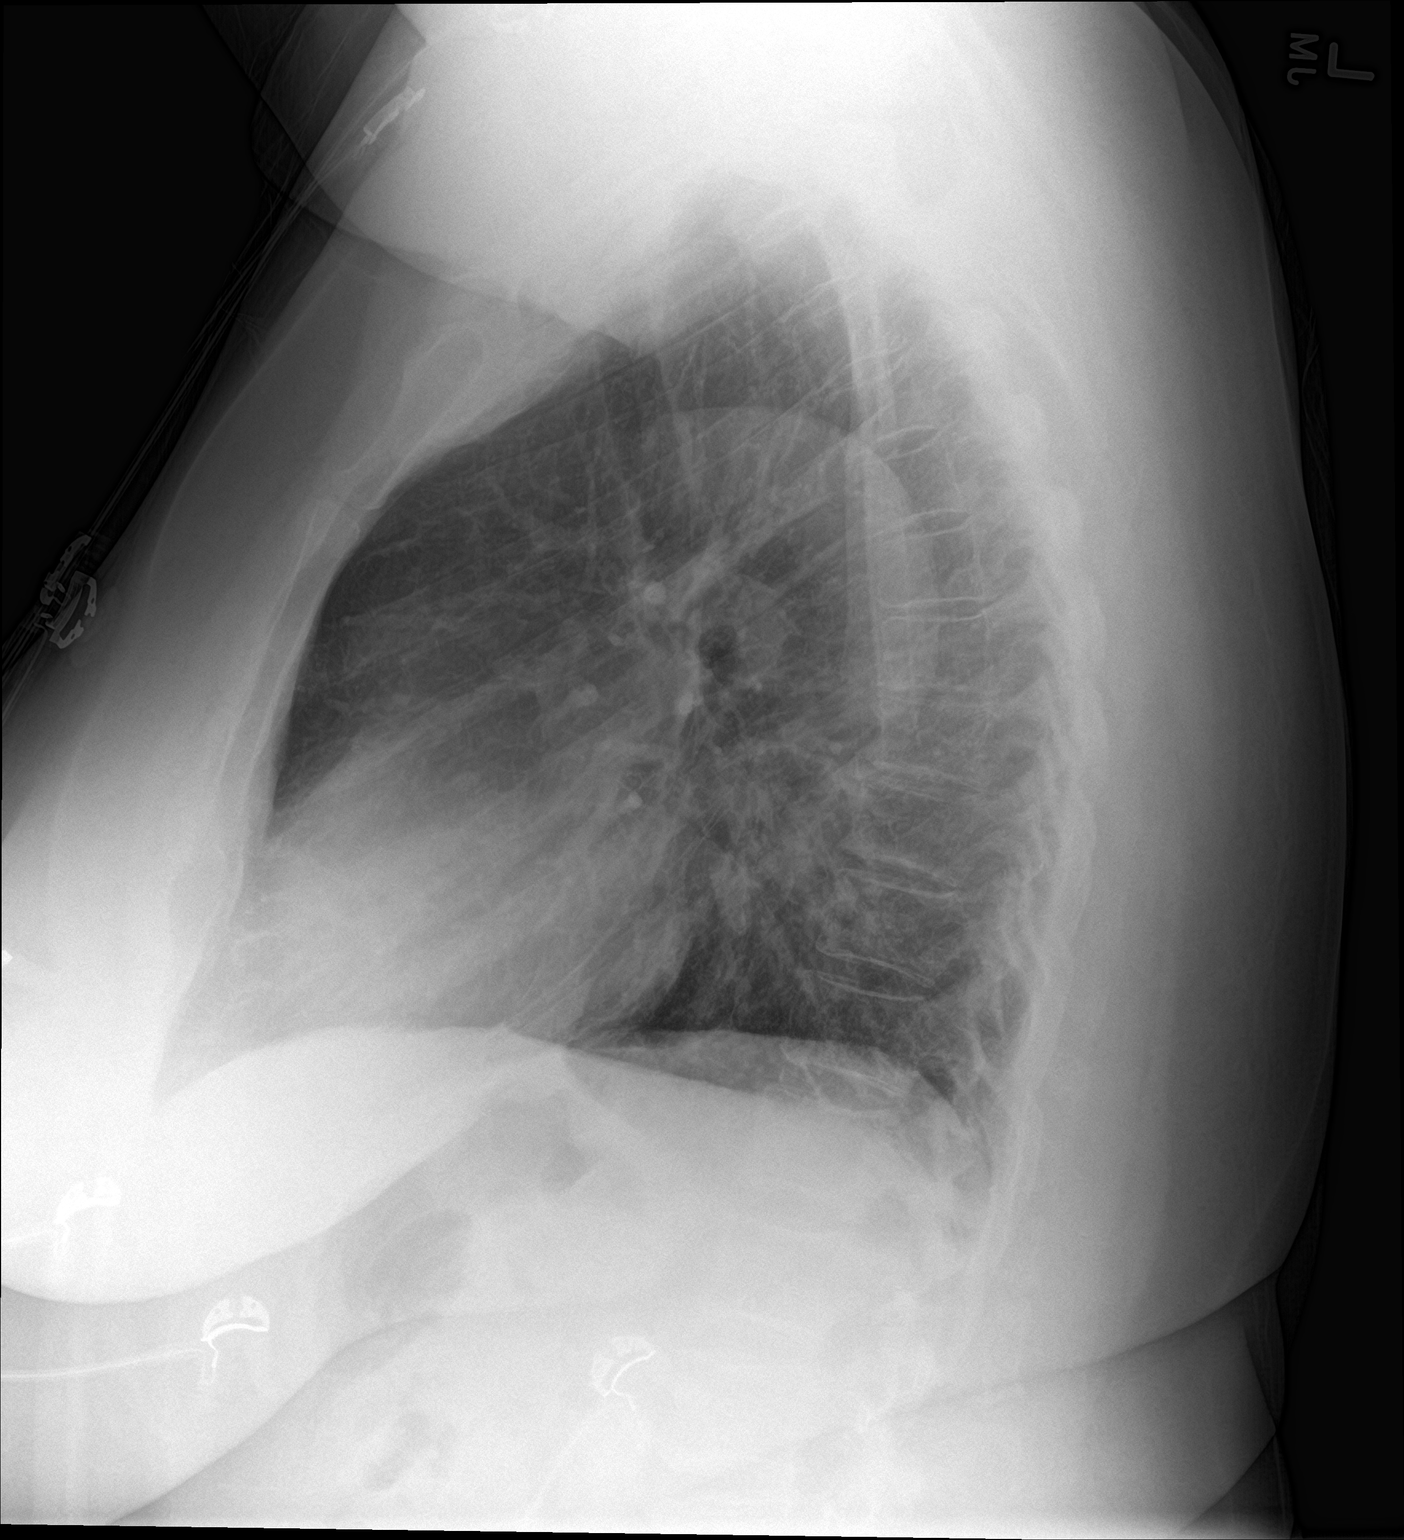

[2 of 2 positions shown; findings below may reference images not displayed]

FINDINGS: The cardiomediastinal silhouette is mildly enlarged. Normal
pulmonary vascularity. No focal consolidation, pleural effusion, or
pneumothorax. No acute osseous abnormality.
IMPRESSION: Mild cardiomegaly.  No active cardiopulmonary disease.

## 2019-05-15 IMAGING — CT CT ABD-PELV W/O CM
2 of 4 series · 16 of 46 positions shown, 18 images · non-contrast
Comparison: 04/28/2015 and prior CTs

CLINICAL DATA: 60-year-old female with decreasing hemoglobin
following cardiac catheterization. Evaluate for retroperitoneal or
abdominal/ pelvic hematoma.

EXAM:
CT ABDOMEN AND PELVIS WITHOUT CONTRAST
TECHNIQUE: Multidetector CT imaging of the abdomen and pelvis was performed
following the standard protocol without IV contrast.

[Series 3: ap without · axial · non-contrast · 0.96mm/px · z∈[+1146,+1571]mm · 13 of 95 slices shown, 15 images]
[im 5/95  soft-tissue]
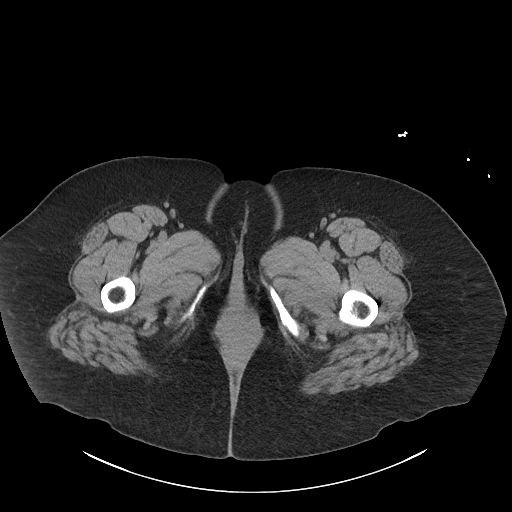
[im 5/95  bone]
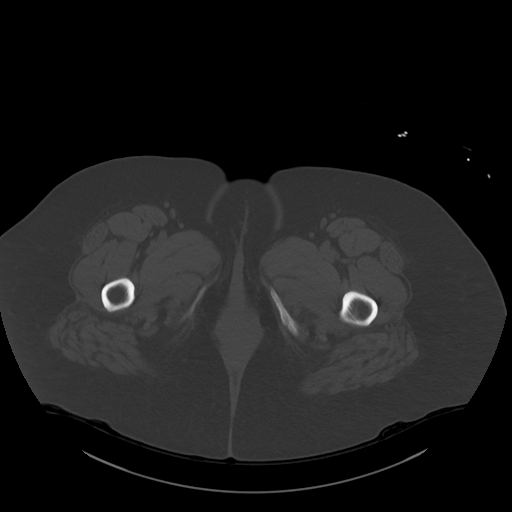
[im 14/95  soft-tissue]
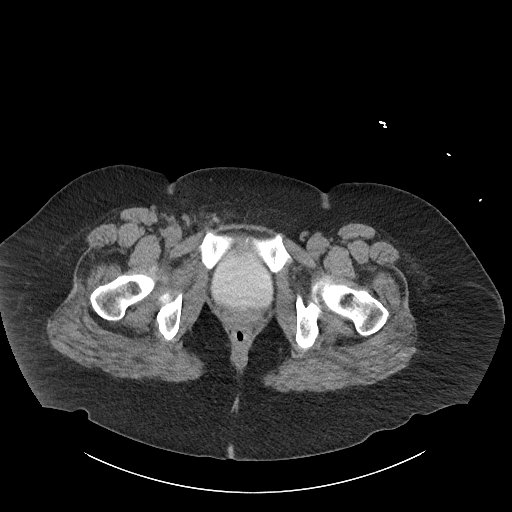
[im 18/95  soft-tissue]
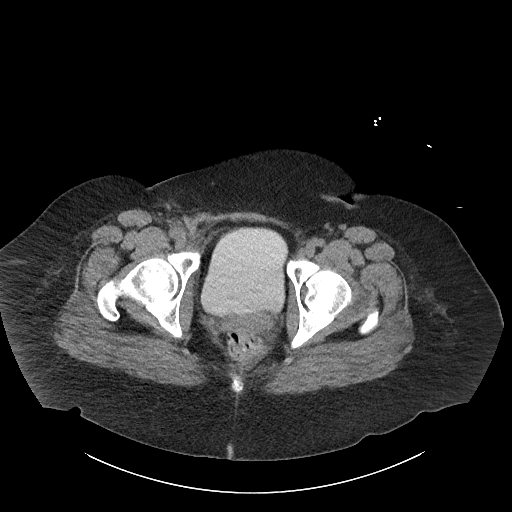
[im 27/95  soft-tissue]
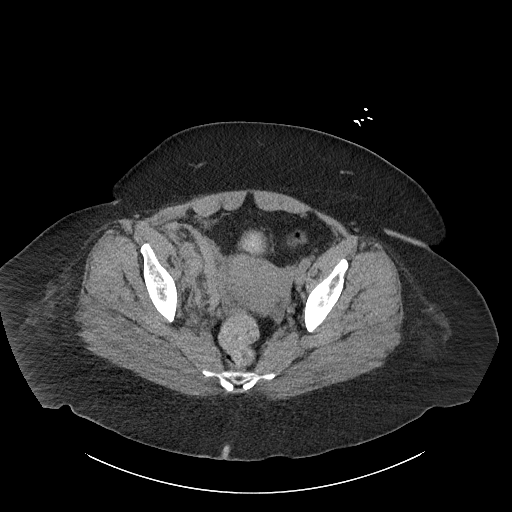
[im 32/95  soft-tissue]
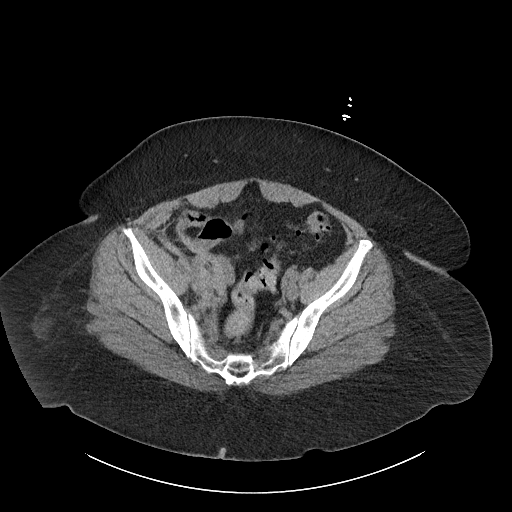
[im 41/95  soft-tissue]
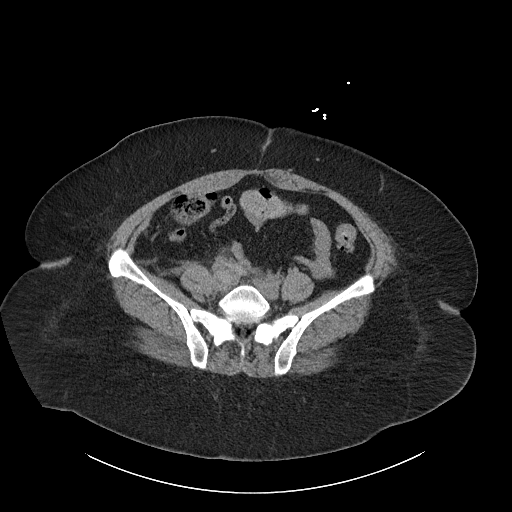
[im 50/95  soft-tissue]
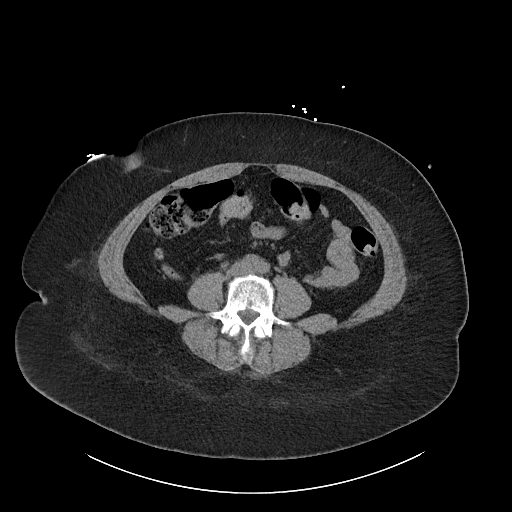
[im 54/95  soft-tissue]
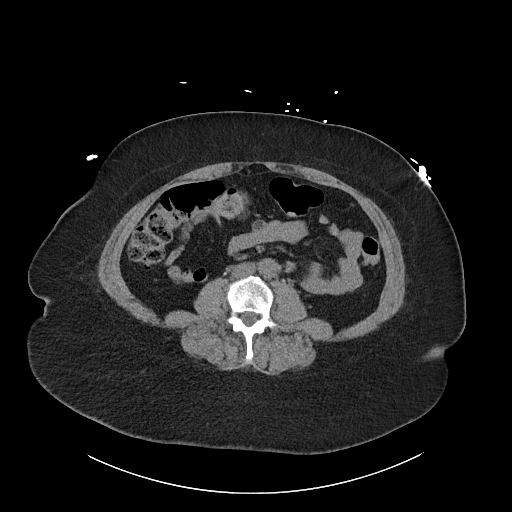
[im 63/95  soft-tissue]
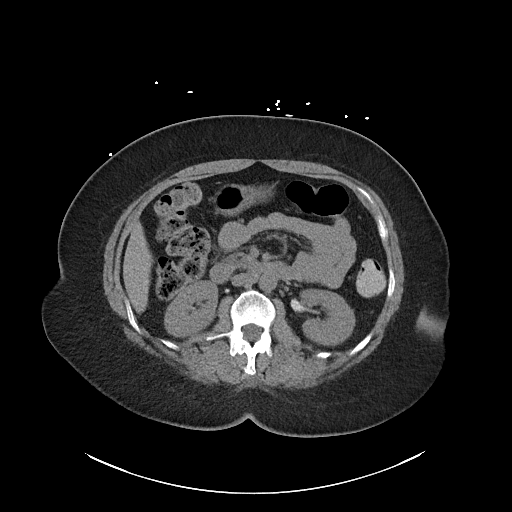
[im 63/95  bone]
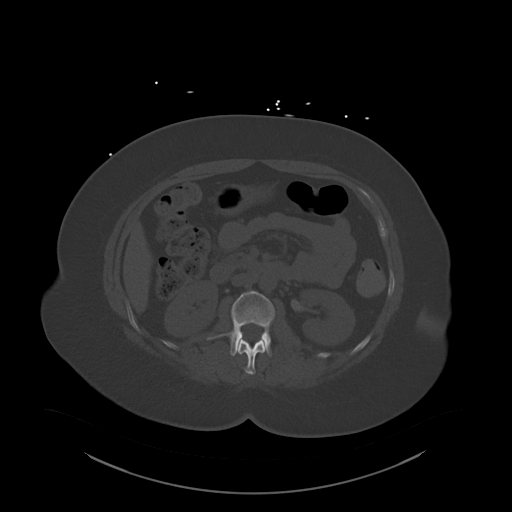
[im 68/95  soft-tissue]
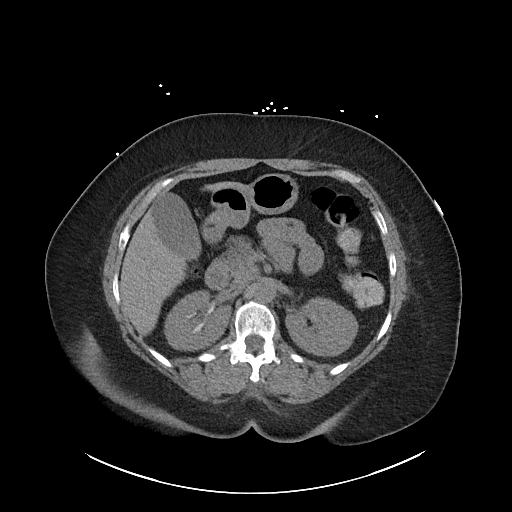
[im 77/95  soft-tissue]
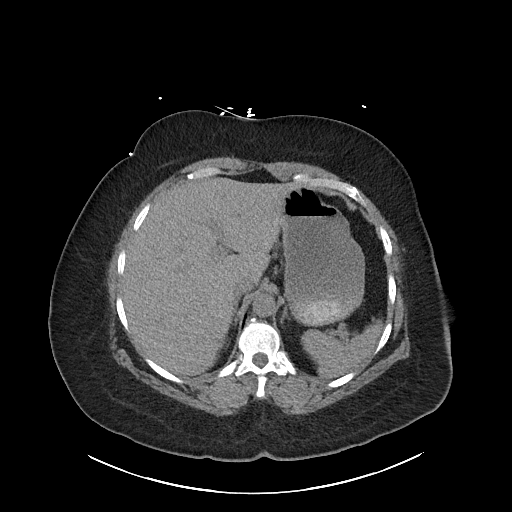
[im 81/95  soft-tissue]
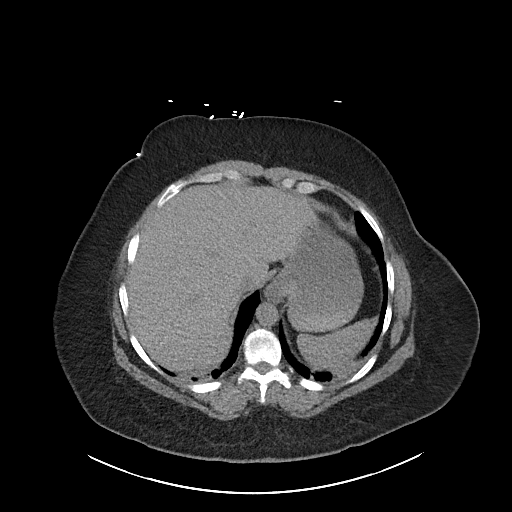
[im 90/95  soft-tissue]
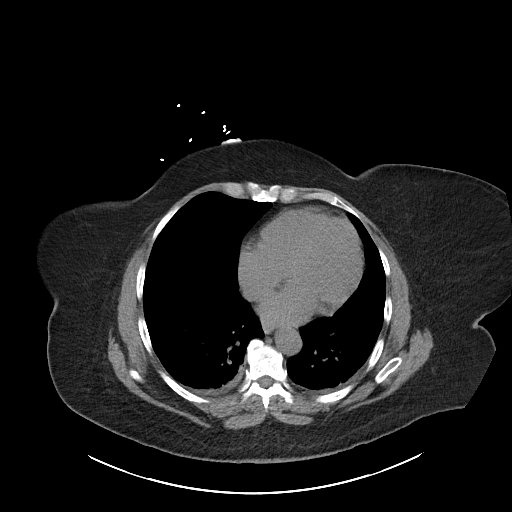

[Series 6: cor · coronal · 0.72mm/px · 3 of 100 slices shown]
[im 34/100  soft-tissue]
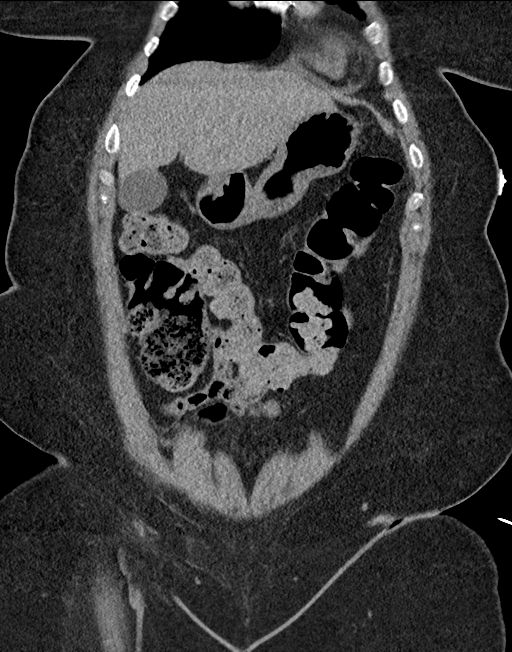
[im 45/100  soft-tissue]
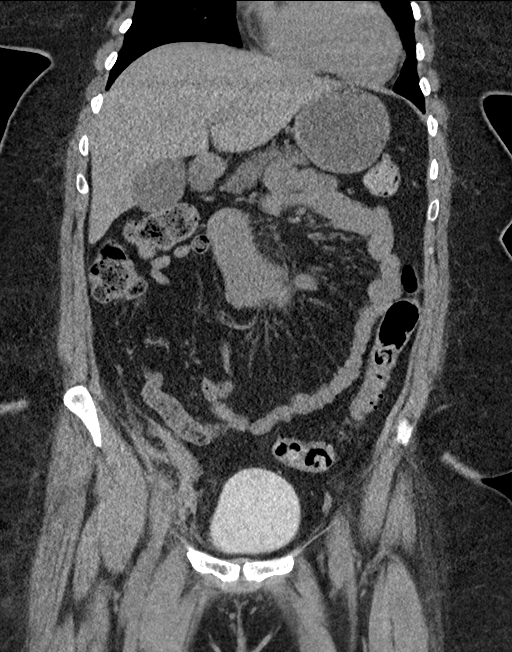
[im 56/100  soft-tissue]
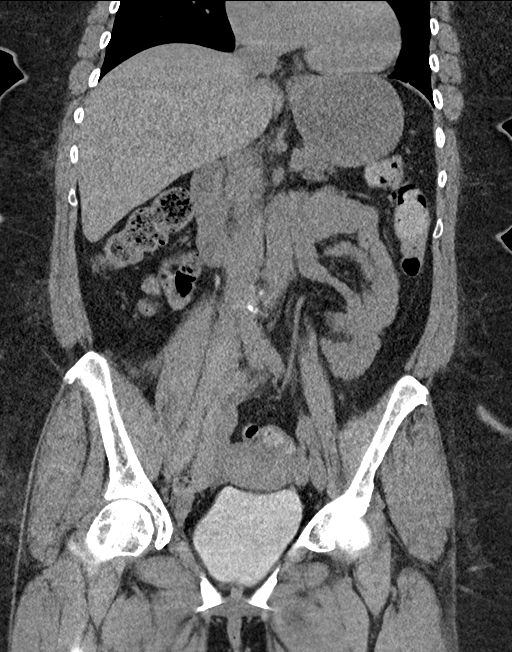

[16 of 46 positions shown; findings below may reference images not displayed]

FINDINGS: Please note that parenchymal abnormalities may be missed without
intravenous contrast.

Lower chest: Mild bibasilar atelectasis noted.

Hepatobiliary: The liver and gallbladder are unremarkable. No
biliary dilatation.

Pancreas: Unremarkable

Spleen: Unremarkable

Adrenals/Urinary Tract: The kidneys, adrenal glands and bladder are
unremarkable. Contrast within the urinary collecting system is noted
from recent catheterization.

Stomach/Bowel: Stomach is within normal limits. No evidence of bowel
wall thickening, distention, or inflammatory changes. Colonic
diverticulosis noted without evidence of diverticulitis.

Vascular/Lymphatic: Ill-defined hemorrhage/hematoma along the right
pelvic sidewall measures 1.8 x 7.5 x 10 cm (transverse x AP x CC).

No other vascular abnormalities noted. No enlarged lymph nodes
identified.

Reproductive: Uterus and bilateral adnexa are unremarkable.

Other: No ascites or pneumoperitoneum

Musculoskeletal: No acute or significant osseous findings.
IMPRESSION: 1. Small to moderate ill-defined hemorrhage/hematoma along the right
pelvic sidewall measuring 1.8 x 7.5 x 10 cm.
2. Mild bibasilar atelectasis.
3. These results will be called to the ordering clinician or
representative by the Radiologist Assistant, and communication
documented in the PACS or zVision Dashboard.

## 2019-05-16 ENCOUNTER — Ambulatory Visit: Payer: Federal, State, Local not specified - PPO | Admitting: Podiatry

## 2019-05-16 ENCOUNTER — Ambulatory Visit (INDEPENDENT_AMBULATORY_CARE_PROVIDER_SITE_OTHER): Payer: Federal, State, Local not specified - PPO

## 2019-05-16 ENCOUNTER — Other Ambulatory Visit: Payer: Self-pay

## 2019-05-16 DIAGNOSIS — M722 Plantar fascial fibromatosis: Secondary | ICD-10-CM

## 2019-05-16 MED ORDER — METHYLPREDNISOLONE 4 MG PO TBPK: ORAL_TABLET | ORAL | 0 refills | Status: DC

## 2019-05-16 NOTE — Progress Notes (Signed)
She presents today states that she has had left heel pain which has been severe in nature for the about the past week and a half.  She has not injured it and has really done nothing to try to make it any better.  She is also picking up her orthotics Dr. Charlsie Merles  prescribed for her.  Objective: Vital signs are stable alert and oriented x3.  Pulses are strongly palpable.  Neurologic sensorium is intact.  Deep tendon reflexes are intact muscle strength is normal and symmetrical.  She has mild tenderness on palpation of the posterior tibial tendon right but severe pain on palpation of the medial calcaneal tubercle of the left heel.  Radiographs taken today do not demonstrate any type of osseous abnormalities other than a small plantar distally oriented calcaneal heel spur and soft tissue increase in density at the plantar fascial calcaneal insertion site consistent with plantar fasciitis.  Assessment: Resolving posterior tibial tendinitis right acute plantar fasciitis left heel.  Plan: Started her on a Medrol Dosepak to be followed by meloxicam.  Injected the left heel today with 20 mg of Kenalog 5 mg Marcaine to the point of maximal tenderness.  Started with use of her orthotics and she was also dispensed a night splint.  Dr. Charlsie Merles will follow up with her in 1 month.  Should she have questions or concerns she will notify us immediately.

## 2019-05-26 ENCOUNTER — Ambulatory Visit: Payer: Federal, State, Local not specified - PPO | Admitting: Podiatry

## 2019-06-08 ENCOUNTER — Other Ambulatory Visit: Payer: Self-pay | Admitting: Family Medicine

## 2019-06-08 DIAGNOSIS — Z1231 Encounter for screening mammogram for malignant neoplasm of breast: Secondary | ICD-10-CM

## 2019-06-14 ENCOUNTER — Other Ambulatory Visit: Payer: Self-pay

## 2019-06-14 ENCOUNTER — Encounter: Payer: Self-pay | Admitting: Podiatry

## 2019-06-14 ENCOUNTER — Ambulatory Visit: Payer: Federal, State, Local not specified - PPO | Admitting: Podiatry

## 2019-06-14 VITALS — Temp 97.8°F

## 2019-06-14 DIAGNOSIS — M722 Plantar fascial fibromatosis: Secondary | ICD-10-CM

## 2019-06-14 DIAGNOSIS — M76821 Posterior tibial tendinitis, right leg: Secondary | ICD-10-CM

## 2019-06-14 NOTE — Progress Notes (Signed)
Subjective:   Patient ID: Sydney Cooper, female   DOB: 64 y.o.   MRN: [id number]   HPI Patient states doing pretty well with the right foot feeling good with the tendon and the left heel being moderately bothersome   ROS      Objective:  Physical Exam  Neurovascular status intact with discomfort of the medial fascial band left still noted upon deep palpation with patient using night splint and orthotics with the right posterior tib doing well     Assessment:  Fasciitis-like symptoms left that still are acute in nature with the right posterior tib doing well     Plan:  H&P reviewed both conditions and for the 1 area of acute as I did do sterile prep and injected the fascia 3 mg 5 mg Xylocaine. Reappoint to recheck

## 2019-08-17 DIAGNOSIS — R87810 Cervical high risk human papillomavirus (HPV) DNA test positive: Secondary | ICD-10-CM | POA: Diagnosis not present

## 2019-08-17 DIAGNOSIS — Z124 Encounter for screening for malignant neoplasm of cervix: Secondary | ICD-10-CM | POA: Diagnosis not present

## 2019-08-17 DIAGNOSIS — Z01419 Encounter for gynecological examination (general) (routine) without abnormal findings: Secondary | ICD-10-CM | POA: Diagnosis not present

## 2019-08-17 DIAGNOSIS — I10 Essential (primary) hypertension: Secondary | ICD-10-CM | POA: Diagnosis not present

## 2019-08-17 DIAGNOSIS — R87619 Unspecified abnormal cytological findings in specimens from cervix uteri: Secondary | ICD-10-CM | POA: Diagnosis not present

## 2019-08-17 DIAGNOSIS — Z1231 Encounter for screening mammogram for malignant neoplasm of breast: Secondary | ICD-10-CM | POA: Diagnosis not present

## 2019-09-14 ENCOUNTER — Other Ambulatory Visit: Payer: Self-pay

## 2019-09-14 ENCOUNTER — Ambulatory Visit: Payer: Federal, State, Local not specified - PPO | Admitting: Podiatry

## 2019-09-14 ENCOUNTER — Encounter: Payer: Self-pay | Admitting: Podiatry

## 2019-09-14 DIAGNOSIS — M722 Plantar fascial fibromatosis: Secondary | ICD-10-CM | POA: Diagnosis not present

## 2019-09-19 NOTE — Progress Notes (Signed)
Subjective:   Patient ID: Sydney Cooper, female   DOB: 64 y.o.   MRN: [id number]   HPI Patient states she was doing great and she is just recently started develop a reoccurrence of pain in the plantar aspect of her left heel.  Does not remember specific injury   ROS      Objective:  Physical Exam  Neurovascular status intact with exquisite discomfort of the left plantar fascia at its insertion into the heel     Assessment:  Acute plantar fasciitis left with inflammation fluid buildup     Plan:  H&P reviewed condition and at this point I went ahead did a sterile prep and injected the fascial insertion 3 mg Kenalog 5 mg Xylocaine and advised on supportive therapy.  Reappoint for [place] to recheck again as needed

## 2019-10-03 DIAGNOSIS — I10 Essential (primary) hypertension: Secondary | ICD-10-CM | POA: Diagnosis not present

## 2019-10-18 ENCOUNTER — Other Ambulatory Visit: Payer: Self-pay | Admitting: Obstetrics and Gynecology

## 2019-10-18 DIAGNOSIS — R87619 Unspecified abnormal cytological findings in specimens from cervix uteri: Secondary | ICD-10-CM | POA: Diagnosis not present

## 2019-12-04 DIAGNOSIS — G4733 Obstructive sleep apnea (adult) (pediatric): Secondary | ICD-10-CM | POA: Diagnosis not present

## 2020-01-04 DIAGNOSIS — G4733 Obstructive sleep apnea (adult) (pediatric): Secondary | ICD-10-CM | POA: Diagnosis not present

## 2020-02-04 DIAGNOSIS — G4733 Obstructive sleep apnea (adult) (pediatric): Secondary | ICD-10-CM | POA: Diagnosis not present

## 2020-03-06 DIAGNOSIS — G4733 Obstructive sleep apnea (adult) (pediatric): Secondary | ICD-10-CM | POA: Diagnosis not present

## 2020-03-11 DIAGNOSIS — G4733 Obstructive sleep apnea (adult) (pediatric): Secondary | ICD-10-CM | POA: Diagnosis not present

## 2020-04-03 DIAGNOSIS — G4733 Obstructive sleep apnea (adult) (pediatric): Secondary | ICD-10-CM | POA: Diagnosis not present

## 2020-05-04 DIAGNOSIS — G4733 Obstructive sleep apnea (adult) (pediatric): Secondary | ICD-10-CM | POA: Diagnosis not present

## 2020-06-03 DIAGNOSIS — G4733 Obstructive sleep apnea (adult) (pediatric): Secondary | ICD-10-CM | POA: Diagnosis not present

## 2020-06-18 DIAGNOSIS — G4733 Obstructive sleep apnea (adult) (pediatric): Secondary | ICD-10-CM | POA: Diagnosis not present

## 2020-06-18 DIAGNOSIS — E782 Mixed hyperlipidemia: Secondary | ICD-10-CM | POA: Diagnosis not present

## 2020-06-18 DIAGNOSIS — R7303 Prediabetes: Secondary | ICD-10-CM | POA: Diagnosis not present

## 2020-06-18 DIAGNOSIS — I10 Essential (primary) hypertension: Secondary | ICD-10-CM | POA: Diagnosis not present

## 2020-07-04 DIAGNOSIS — G4733 Obstructive sleep apnea (adult) (pediatric): Secondary | ICD-10-CM | POA: Diagnosis not present

## 2020-07-17 DIAGNOSIS — Z20822 Contact with and (suspected) exposure to covid-19: Secondary | ICD-10-CM | POA: Diagnosis not present

## 2020-08-03 DIAGNOSIS — G4733 Obstructive sleep apnea (adult) (pediatric): Secondary | ICD-10-CM | POA: Diagnosis not present

## 2020-08-06 DIAGNOSIS — R7303 Prediabetes: Secondary | ICD-10-CM | POA: Diagnosis not present

## 2020-08-06 DIAGNOSIS — I10 Essential (primary) hypertension: Secondary | ICD-10-CM | POA: Diagnosis not present

## 2020-08-06 DIAGNOSIS — E782 Mixed hyperlipidemia: Secondary | ICD-10-CM | POA: Diagnosis not present

## 2020-08-07 DIAGNOSIS — R7303 Prediabetes: Secondary | ICD-10-CM | POA: Diagnosis not present

## 2020-08-07 DIAGNOSIS — I10 Essential (primary) hypertension: Secondary | ICD-10-CM | POA: Diagnosis not present

## 2020-08-07 DIAGNOSIS — E782 Mixed hyperlipidemia: Secondary | ICD-10-CM | POA: Diagnosis not present

## 2020-08-22 ENCOUNTER — Ambulatory Visit: Payer: Federal, State, Local not specified - PPO | Admitting: Podiatry

## 2020-08-22 ENCOUNTER — Ambulatory Visit (INDEPENDENT_AMBULATORY_CARE_PROVIDER_SITE_OTHER): Payer: Federal, State, Local not specified - PPO

## 2020-08-22 ENCOUNTER — Other Ambulatory Visit: Payer: Self-pay

## 2020-08-22 DIAGNOSIS — M76821 Posterior tibial tendinitis, right leg: Secondary | ICD-10-CM

## 2020-08-22 MED ORDER — DICLOFENAC SODIUM 75 MG PO TBEC: 75.0000 mg | DELAYED_RELEASE_TABLET | Freq: Two times a day (BID) | ORAL | 2 refills | Status: AC

## 2020-08-22 MED ORDER — TRIAMCINOLONE ACETONIDE 10 MG/ML IJ SUSP
10.0000 mg | Freq: Once | INTRAMUSCULAR | Status: AC
  Administered 2020-08-22: 10 mg

## 2020-08-26 DIAGNOSIS — Z1231 Encounter for screening mammogram for malignant neoplasm of breast: Secondary | ICD-10-CM | POA: Diagnosis not present

## 2020-08-26 DIAGNOSIS — R87610 Atypical squamous cells of undetermined significance on cytologic smear of cervix (ASC-US): Secondary | ICD-10-CM | POA: Diagnosis not present

## 2020-08-26 DIAGNOSIS — Z01419 Encounter for gynecological examination (general) (routine) without abnormal findings: Secondary | ICD-10-CM | POA: Diagnosis not present

## 2020-08-26 NOTE — Progress Notes (Signed)
Subjective:   Patient ID: Sydney Cooper, female   DOB: 65 y.o.   MRN: [id number]   HPI Patient presents stating she is getting a lot of pain in the inside of her right ankle and its throbbing aches with some swelling and it started in the last couple weeks.  She is getting ready to go to [place] and needs to be able to walk   ROS      Objective:  Physical Exam  Neurovascular status intact with exquisite discomfort around the posterior tibial tendon as it comes underneath the medial malleolus right with fluid buildup noted and flattening of the arch.  Patient is found to have good digital perfusion well oriented x3     Assessment:  Posterior tibial tendinitis right with inflammation fluid buildup with flatfoot deformity     Plan:  H&P x-ray reviewed and I went ahead did sterile sheath injection of the posterior tibial tendon I dispensed a fascial brace I instructed on her wearing air fracture walker and I want to see her back before she leaves for her trip for possible secondary injection.  Patient will be seen back and hopefully will make a big difference for her  X-rays indicate moderate depression of the arch no other indications of current pathology

## 2020-09-03 DIAGNOSIS — G4733 Obstructive sleep apnea (adult) (pediatric): Secondary | ICD-10-CM | POA: Diagnosis not present

## 2020-09-05 ENCOUNTER — Other Ambulatory Visit: Payer: Self-pay

## 2020-09-05 ENCOUNTER — Encounter: Payer: Self-pay | Admitting: Podiatry

## 2020-09-05 ENCOUNTER — Ambulatory Visit: Payer: Federal, State, Local not specified - PPO | Admitting: Podiatry

## 2020-09-05 DIAGNOSIS — L84 Corns and callosities: Secondary | ICD-10-CM

## 2020-09-05 DIAGNOSIS — M79671 Pain in right foot: Secondary | ICD-10-CM

## 2020-09-05 DIAGNOSIS — M76821 Posterior tibial tendinitis, right leg: Secondary | ICD-10-CM

## 2020-09-05 NOTE — Progress Notes (Signed)
Subjective:   Patient ID: Sydney Cooper, female   DOB: 65 y.o.   MRN: [id number]   HPI Patient presents stating she still is getting mild pain but it is improved quite a bit and has stopped wearing the boot and is wearing the brace currently and also has a callus on the inside of the right big toe she is concerned about   ROS      Objective:  Physical Exam  Neurovascular status intact with pain around the posterior tibial tendon right that is improved with mild generalized foot pain which is improved but present.  I did not note any injury to the tendon the sheath is no longer swollen and I tested eversion inversion and found the tendon to be functioning well.  Also noted keratotic lesion right hallux medial side moderately tender     Assessment:  Posterior tibial tendinitis right improving along with generalized foot pain and keratotic lesion formation     Plan:  H&P reviewed all 3 problems separately and recommended continued insert wearing to support the medial longitudinal arch along with stretching exercises boot as needed oral anti-inflammatories as needed.  Debrided the lesion on the right big toe

## 2020-10-04 DIAGNOSIS — G4733 Obstructive sleep apnea (adult) (pediatric): Secondary | ICD-10-CM | POA: Diagnosis not present

## 2020-11-03 DIAGNOSIS — G4733 Obstructive sleep apnea (adult) (pediatric): Secondary | ICD-10-CM | POA: Diagnosis not present

## 2020-11-27 DIAGNOSIS — M25552 Pain in left hip: Secondary | ICD-10-CM | POA: Diagnosis not present

## 2020-11-27 DIAGNOSIS — M545 Low back pain, unspecified: Secondary | ICD-10-CM | POA: Diagnosis not present

## 2020-12-04 DIAGNOSIS — G4733 Obstructive sleep apnea (adult) (pediatric): Secondary | ICD-10-CM | POA: Diagnosis not present

## 2021-06-25 ENCOUNTER — Ambulatory Visit (INDEPENDENT_AMBULATORY_CARE_PROVIDER_SITE_OTHER): Payer: Medicare Other | Admitting: Podiatry

## 2021-06-25 ENCOUNTER — Encounter: Payer: Self-pay | Admitting: Podiatry

## 2021-06-25 ENCOUNTER — Ambulatory Visit (INDEPENDENT_AMBULATORY_CARE_PROVIDER_SITE_OTHER): Payer: Medicare Other

## 2021-06-25 DIAGNOSIS — M76821 Posterior tibial tendinitis, right leg: Secondary | ICD-10-CM

## 2021-06-25 MED ORDER — TRIAMCINOLONE ACETONIDE 10 MG/ML IJ SUSP
10.0000 mg | Freq: Once | INTRAMUSCULAR | Status: AC
  Administered 2021-06-25: 10 mg

## 2021-06-25 NOTE — Progress Notes (Signed)
Subjective:   Patient ID: Sydney Cooper, female   DOB: 66 y.o.   MRN: [id number]   HPI Patient presents stating that she is getting discomfort again in her right ankle and she is due to go out of the country in the next couple weeks   ROS      Objective:  Physical Exam  Neurovascular status intact with inflammation pain of the medial side of the right posterior tibial insertion right with the patient who does not have any type of boot for this particular condition     Assessment:  Acute posterior tibial tendinitis right with patient due to go out of town     Plan:  H&P reviewed the condition did sterile prep and injected the sheath of the posterior tib tendon 3 mg Kenalog 5 mg Xylocaine advised on ice therapy and dispensed air fracture walker to completely immobilize for the next 2 weeks.  Reappoint as symptoms indicate.  X-rays do indicate moderate depression of the arch no other changes from previous visit

## 2021-09-23 ENCOUNTER — Other Ambulatory Visit (HOSPITAL_COMMUNITY): Payer: Self-pay

## 2021-09-23 MED ORDER — WEGOVY 0.25 MG/0.5ML ~~LOC~~ SOAJ
SUBCUTANEOUS | 0 refills | Status: AC
  Filled 2021-09-23 – 2021-11-21 (×3): qty 2, 28d supply, fill #0

## 2021-10-01 ENCOUNTER — Other Ambulatory Visit (HOSPITAL_COMMUNITY): Payer: Self-pay

## 2021-10-07 ENCOUNTER — Other Ambulatory Visit (HOSPITAL_COMMUNITY): Payer: Self-pay

## 2021-10-08 ENCOUNTER — Other Ambulatory Visit (HOSPITAL_COMMUNITY): Payer: Self-pay

## 2021-10-11 ENCOUNTER — Other Ambulatory Visit (HOSPITAL_COMMUNITY): Payer: Self-pay

## 2021-10-21 ENCOUNTER — Other Ambulatory Visit (HOSPITAL_COMMUNITY): Payer: Self-pay

## 2021-10-21 MED ORDER — TECHLITE PEN NEEDLES 32G X 4 MM MISC
0 refills | Status: AC
  Filled 2021-10-21: qty 100, 90d supply, fill #0

## 2021-10-21 MED ORDER — SAXENDA 18 MG/3ML ~~LOC~~ SOPN
3.0000 mg | PEN_INJECTOR | Freq: Every day | SUBCUTANEOUS | 1 refills | Status: AC
  Filled 2021-10-21: qty 15, 30d supply, fill #0

## 2021-10-22 ENCOUNTER — Other Ambulatory Visit (HOSPITAL_COMMUNITY): Payer: Self-pay

## 2021-10-23 ENCOUNTER — Other Ambulatory Visit (HOSPITAL_COMMUNITY): Payer: Self-pay

## 2021-10-27 ENCOUNTER — Other Ambulatory Visit (HOSPITAL_COMMUNITY): Payer: Self-pay

## 2021-11-12 ENCOUNTER — Other Ambulatory Visit (HOSPITAL_COMMUNITY): Payer: Self-pay

## 2021-11-20 ENCOUNTER — Other Ambulatory Visit (HOSPITAL_COMMUNITY): Payer: Self-pay

## 2021-11-20 MED ORDER — WEGOVY 1.7 MG/0.75ML ~~LOC~~ SOAJ
1.7000 mg | SUBCUTANEOUS | 1 refills | Status: AC
  Filled 2021-11-20 – 2021-12-23 (×2): qty 3, 28d supply, fill #0

## 2021-11-21 ENCOUNTER — Other Ambulatory Visit (HOSPITAL_COMMUNITY): Payer: Self-pay

## 2021-11-22 ENCOUNTER — Other Ambulatory Visit (HOSPITAL_COMMUNITY): Payer: Self-pay

## 2021-11-25 ENCOUNTER — Other Ambulatory Visit (HOSPITAL_COMMUNITY): Payer: Self-pay

## 2021-12-17 ENCOUNTER — Other Ambulatory Visit (HOSPITAL_COMMUNITY): Payer: Self-pay

## 2021-12-17 MED ORDER — WEGOVY 2.4 MG/0.75ML ~~LOC~~ SOAJ
2.4000 mg | SUBCUTANEOUS | 0 refills | Status: DC
  Filled 2021-12-17: qty 3, 28d supply, fill #0

## 2021-12-18 ENCOUNTER — Other Ambulatory Visit (HOSPITAL_COMMUNITY): Payer: Self-pay

## 2021-12-23 ENCOUNTER — Other Ambulatory Visit (HOSPITAL_COMMUNITY): Payer: Self-pay

## 2022-01-29 ENCOUNTER — Other Ambulatory Visit (HOSPITAL_COMMUNITY): Payer: Self-pay

## 2022-01-29 MED ORDER — WEGOVY 2.4 MG/0.75ML ~~LOC~~ SOAJ
2.4000 mg | SUBCUTANEOUS | 1 refills | Status: AC
  Filled 2022-01-29: qty 3, 28d supply, fill #0

## 2022-01-30 ENCOUNTER — Other Ambulatory Visit (HOSPITAL_COMMUNITY): Payer: Self-pay

## 2022-03-02 ENCOUNTER — Other Ambulatory Visit (HOSPITAL_COMMUNITY): Payer: Self-pay

## 2022-03-02 MED ORDER — ZEPBOUND 5 MG/0.5ML ~~LOC~~ SOAJ
SUBCUTANEOUS | 0 refills | Status: AC
  Filled 2022-03-02 – 2022-03-10 (×2): qty 2, 28d supply, fill #0

## 2022-03-10 ENCOUNTER — Other Ambulatory Visit (HOSPITAL_COMMUNITY): Payer: Self-pay

## 2022-03-16 ENCOUNTER — Other Ambulatory Visit (HOSPITAL_COMMUNITY): Payer: Self-pay

## 2022-03-16 MED ORDER — WEGOVY 2.4 MG/0.75ML ~~LOC~~ SOAJ
2.4000 mg | SUBCUTANEOUS | 1 refills | Status: AC
  Filled 2022-03-16 – 2022-03-17 (×2): qty 3, 28d supply, fill #0

## 2022-03-17 ENCOUNTER — Other Ambulatory Visit (HOSPITAL_COMMUNITY): Payer: Self-pay

## 2022-03-19 ENCOUNTER — Other Ambulatory Visit (HOSPITAL_COMMUNITY): Payer: Self-pay

## 2022-03-30 ENCOUNTER — Other Ambulatory Visit (HOSPITAL_COMMUNITY): Payer: Self-pay

## 2022-03-30 MED ORDER — WEGOVY 2.4 MG/0.75ML ~~LOC~~ SOAJ
2.4000 mg | SUBCUTANEOUS | 1 refills | Status: AC
  Filled 2022-03-30: qty 3, 28d supply, fill #0

## 2022-04-17 ENCOUNTER — Ambulatory Visit (INDEPENDENT_AMBULATORY_CARE_PROVIDER_SITE_OTHER): Payer: Medicare Other | Admitting: Podiatry

## 2022-04-17 ENCOUNTER — Ambulatory Visit (INDEPENDENT_AMBULATORY_CARE_PROVIDER_SITE_OTHER): Payer: Medicare Other

## 2022-04-17 ENCOUNTER — Encounter: Payer: Self-pay | Admitting: Podiatry

## 2022-04-17 DIAGNOSIS — M76821 Posterior tibial tendinitis, right leg: Secondary | ICD-10-CM

## 2022-04-17 MED ORDER — TRIAMCINOLONE ACETONIDE 10 MG/ML IJ SUSP
10.0000 mg | Freq: Once | INTRAMUSCULAR | Status: AC
  Administered 2022-04-17: 10 mg

## 2022-04-17 NOTE — Progress Notes (Signed)
Subjective:   Patient ID: Sydney Cooper, female   DOB: 67 y.o.   MRN: [id number]   HPI Patient states right ankle has started to flareup again and it did very well for about 8 months   ROS      Objective:  Physical Exam  Ocular status intact with inflammation of the medial ankle consistent with posterior tibial tendinitis that is responded previously to conservative treatment     Assessment:  Posterior tibial tendinitis right with inflammation     Plan:  Reviewed condition x-ray indicating depression of the arch but no significant changes from previous x-rays sterile prep injected the sheath of the tendon 3 mg Dexasone Kenalog 5 mg Xylocaine advised on supportive shoes reappoint to recheck  X-rays do indicate moderate depression of the arch but no indications of current collapse

## 2022-05-03 ENCOUNTER — Other Ambulatory Visit (HOSPITAL_COMMUNITY): Payer: Self-pay

## 2022-05-06 ENCOUNTER — Other Ambulatory Visit (HOSPITAL_BASED_OUTPATIENT_CLINIC_OR_DEPARTMENT_OTHER): Payer: Self-pay

## 2022-05-06 MED ORDER — WEGOVY 2.4 MG/0.75ML ~~LOC~~ SOAJ
2.4000 mg | SUBCUTANEOUS | 1 refills | Status: AC
  Filled 2022-05-06 – 2022-07-13 (×2): qty 3, 28d supply, fill #0

## 2022-05-11 ENCOUNTER — Other Ambulatory Visit (HOSPITAL_COMMUNITY): Payer: Self-pay

## 2022-06-09 ENCOUNTER — Other Ambulatory Visit (HOSPITAL_BASED_OUTPATIENT_CLINIC_OR_DEPARTMENT_OTHER): Payer: Self-pay

## 2022-06-09 MED ORDER — WEGOVY 2.4 MG/0.75ML ~~LOC~~ SOAJ
2.4000 mg | SUBCUTANEOUS | 1 refills | Status: AC
  Filled 2022-06-09: qty 3, 28d supply, fill #0

## 2022-07-07 ENCOUNTER — Other Ambulatory Visit (HOSPITAL_BASED_OUTPATIENT_CLINIC_OR_DEPARTMENT_OTHER): Payer: Self-pay

## 2022-07-07 MED ORDER — ZEPBOUND 5 MG/0.5ML ~~LOC~~ SOAJ
5.0000 mg | SUBCUTANEOUS | 1 refills | Status: DC
  Filled 2022-07-07 – 2022-08-05 (×3): qty 2, 28d supply, fill #0

## 2022-07-13 ENCOUNTER — Other Ambulatory Visit (HOSPITAL_BASED_OUTPATIENT_CLINIC_OR_DEPARTMENT_OTHER): Payer: Self-pay

## 2022-08-05 ENCOUNTER — Other Ambulatory Visit (HOSPITAL_BASED_OUTPATIENT_CLINIC_OR_DEPARTMENT_OTHER): Payer: Self-pay

## 2022-08-05 MED ORDER — WEGOVY 2.4 MG/0.75ML ~~LOC~~ SOAJ
2.4000 mg | SUBCUTANEOUS | 1 refills | Status: AC
  Filled 2022-08-05: qty 3, 28d supply, fill #0

## 2022-08-07 ENCOUNTER — Other Ambulatory Visit (HOSPITAL_COMMUNITY): Payer: Self-pay

## 2022-09-09 ENCOUNTER — Other Ambulatory Visit (HOSPITAL_BASED_OUTPATIENT_CLINIC_OR_DEPARTMENT_OTHER): Payer: Self-pay

## 2022-09-09 MED ORDER — WEGOVY 2.4 MG/0.75ML ~~LOC~~ SOAJ
2.4000 mg | SUBCUTANEOUS | 1 refills | Status: AC
  Filled 2022-09-09: qty 3, 28d supply, fill #0

## 2022-10-21 ENCOUNTER — Other Ambulatory Visit (HOSPITAL_BASED_OUTPATIENT_CLINIC_OR_DEPARTMENT_OTHER): Payer: Self-pay

## 2022-10-21 MED ORDER — WEGOVY 2.4 MG/0.75ML ~~LOC~~ SOAJ
2.4000 mg | SUBCUTANEOUS | 1 refills | Status: AC
  Filled 2022-10-21: qty 3, 28d supply, fill #0

## 2022-11-11 ENCOUNTER — Other Ambulatory Visit (HOSPITAL_BASED_OUTPATIENT_CLINIC_OR_DEPARTMENT_OTHER): Payer: Self-pay

## 2022-11-11 MED ORDER — WEGOVY 2.4 MG/0.75ML ~~LOC~~ SOAJ
2.4000 mg | SUBCUTANEOUS | 1 refills | Status: AC
  Filled 2022-11-13: qty 3, 28d supply, fill #0

## 2022-11-13 ENCOUNTER — Other Ambulatory Visit (HOSPITAL_BASED_OUTPATIENT_CLINIC_OR_DEPARTMENT_OTHER): Payer: Self-pay

## 2022-11-23 ENCOUNTER — Other Ambulatory Visit (HOSPITAL_BASED_OUTPATIENT_CLINIC_OR_DEPARTMENT_OTHER): Payer: Self-pay

## 2022-11-23 MED ORDER — WEGOVY 2.4 MG/0.75ML ~~LOC~~ SOAJ
2.4000 mg | SUBCUTANEOUS | 1 refills | Status: AC
  Filled 2022-11-23: qty 3, 28d supply, fill #0

## 2023-01-21 ENCOUNTER — Other Ambulatory Visit (HOSPITAL_BASED_OUTPATIENT_CLINIC_OR_DEPARTMENT_OTHER): Payer: Self-pay

## 2023-01-21 MED ORDER — WEGOVY 2.4 MG/0.75ML ~~LOC~~ SOAJ
2.4000 mg | SUBCUTANEOUS | 1 refills | Status: AC
  Filled 2023-01-21: qty 3, 28d supply, fill #0
  Filled 2023-06-03 – 2023-06-07 (×2): qty 3, 28d supply, fill #1

## 2023-03-17 ENCOUNTER — Other Ambulatory Visit (HOSPITAL_BASED_OUTPATIENT_CLINIC_OR_DEPARTMENT_OTHER): Payer: Self-pay

## 2023-03-17 MED ORDER — WEGOVY 2.4 MG/0.75ML ~~LOC~~ SOAJ
2.4000 mg | SUBCUTANEOUS | 1 refills | Status: AC
  Filled 2023-03-17: qty 3, 28d supply, fill #0
  Filled 2023-04-14: qty 3, 28d supply, fill #1

## 2023-06-02 ENCOUNTER — Other Ambulatory Visit (HOSPITAL_BASED_OUTPATIENT_CLINIC_OR_DEPARTMENT_OTHER): Payer: Self-pay

## 2023-06-02 MED ORDER — WEGOVY 1.7 MG/0.75ML ~~LOC~~ SOAJ
1.7000 mg | SUBCUTANEOUS | 1 refills | Status: AC
  Filled 2023-06-02: qty 3, 28d supply, fill #0

## 2023-06-03 ENCOUNTER — Other Ambulatory Visit (HOSPITAL_BASED_OUTPATIENT_CLINIC_OR_DEPARTMENT_OTHER): Payer: Self-pay

## 2023-06-03 ENCOUNTER — Other Ambulatory Visit: Payer: Self-pay

## 2023-06-07 ENCOUNTER — Other Ambulatory Visit (HOSPITAL_BASED_OUTPATIENT_CLINIC_OR_DEPARTMENT_OTHER): Payer: Self-pay

## 2023-07-07 ENCOUNTER — Other Ambulatory Visit: Payer: Self-pay

## 2023-07-07 ENCOUNTER — Other Ambulatory Visit (HOSPITAL_BASED_OUTPATIENT_CLINIC_OR_DEPARTMENT_OTHER): Payer: Self-pay

## 2023-07-07 MED ORDER — WEGOVY 2.4 MG/0.75ML ~~LOC~~ SOAJ
2.4000 mg | SUBCUTANEOUS | 2 refills | Status: AC
  Filled 2023-07-07 (×2): qty 3, 28d supply, fill #0
  Filled 2023-08-03: qty 3, 28d supply, fill #1
  Filled 2023-08-26: qty 3, 28d supply, fill #2

## 2023-08-03 ENCOUNTER — Other Ambulatory Visit (HOSPITAL_BASED_OUTPATIENT_CLINIC_OR_DEPARTMENT_OTHER): Payer: Self-pay

## 2023-08-03 MED ORDER — WEGOVY 2.4 MG/0.75ML ~~LOC~~ SOAJ
2.4000 mg | SUBCUTANEOUS | 1 refills | Status: AC
  Filled 2023-08-03: qty 3, 28d supply, fill #0

## 2023-08-26 ENCOUNTER — Other Ambulatory Visit: Payer: Self-pay

## 2023-09-28 ENCOUNTER — Other Ambulatory Visit (HOSPITAL_BASED_OUTPATIENT_CLINIC_OR_DEPARTMENT_OTHER): Payer: Self-pay

## 2023-09-28 MED ORDER — WEGOVY 2.4 MG/0.75ML ~~LOC~~ SOAJ
2.4000 mg | SUBCUTANEOUS | 1 refills | Status: AC
  Filled 2023-09-28: qty 3, 28d supply, fill #0
  Filled 2023-10-24: qty 3, 28d supply, fill #1

## 2023-10-13 ENCOUNTER — Other Ambulatory Visit (HOSPITAL_BASED_OUTPATIENT_CLINIC_OR_DEPARTMENT_OTHER): Payer: Self-pay

## 2023-10-13 MED ORDER — WEGOVY 2.4 MG/0.75ML ~~LOC~~ SOAJ
2.4000 mg | SUBCUTANEOUS | 1 refills | Status: AC
  Filled 2023-11-29: qty 3, 28d supply, fill #0
  Filled 2023-12-21: qty 3, 28d supply, fill #1

## 2023-10-27 ENCOUNTER — Other Ambulatory Visit (HOSPITAL_COMMUNITY): Payer: Self-pay

## 2023-10-27 ENCOUNTER — Other Ambulatory Visit (HOSPITAL_BASED_OUTPATIENT_CLINIC_OR_DEPARTMENT_OTHER): Payer: Self-pay

## 2023-11-03 ENCOUNTER — Other Ambulatory Visit: Payer: Self-pay

## 2023-11-29 ENCOUNTER — Other Ambulatory Visit (HOSPITAL_BASED_OUTPATIENT_CLINIC_OR_DEPARTMENT_OTHER): Payer: Self-pay

## 2023-12-21 ENCOUNTER — Other Ambulatory Visit (HOSPITAL_BASED_OUTPATIENT_CLINIC_OR_DEPARTMENT_OTHER): Payer: Self-pay

## 2024-01-20 MED ORDER — ZEPBOUND 7.5 MG/0.5ML ~~LOC~~ SOAJ
7.5000 mg | SUBCUTANEOUS | 1 refills | Status: AC
  Filled 2024-01-20 – 2024-01-25 (×2): qty 2, 28d supply, fill #0
  Filled 2024-02-22: qty 2, 28d supply, fill #1

## 2024-01-25 ENCOUNTER — Other Ambulatory Visit (HOSPITAL_BASED_OUTPATIENT_CLINIC_OR_DEPARTMENT_OTHER): Payer: Self-pay

## 2024-02-22 ENCOUNTER — Other Ambulatory Visit (HOSPITAL_BASED_OUTPATIENT_CLINIC_OR_DEPARTMENT_OTHER): Payer: Self-pay

## 2024-02-24 ENCOUNTER — Other Ambulatory Visit (HOSPITAL_BASED_OUTPATIENT_CLINIC_OR_DEPARTMENT_OTHER): Payer: Self-pay

## 2024-02-25 ENCOUNTER — Other Ambulatory Visit (HOSPITAL_BASED_OUTPATIENT_CLINIC_OR_DEPARTMENT_OTHER): Payer: Self-pay

## 2024-02-28 ENCOUNTER — Other Ambulatory Visit (HOSPITAL_BASED_OUTPATIENT_CLINIC_OR_DEPARTMENT_OTHER): Payer: Self-pay

## 2024-02-29 ENCOUNTER — Other Ambulatory Visit (HOSPITAL_BASED_OUTPATIENT_CLINIC_OR_DEPARTMENT_OTHER): Payer: Self-pay

## 2024-03-07 ENCOUNTER — Other Ambulatory Visit (HOSPITAL_BASED_OUTPATIENT_CLINIC_OR_DEPARTMENT_OTHER): Payer: Self-pay
